# Patient Record
Sex: Female | Born: 1950 | Race: White | Hispanic: No | Marital: Married | Smoking: Former smoker
Health system: Southern US, Community
[De-identification: ages and names within clinical notes are randomized; demographics above are authoritative.]

## PROBLEM LIST (undated history)

## (undated) DIAGNOSIS — E785 Hyperlipidemia, unspecified: Secondary | ICD-10-CM

## (undated) DIAGNOSIS — F329 Major depressive disorder, single episode, unspecified: Secondary | ICD-10-CM

## (undated) DIAGNOSIS — T7840XA Allergy, unspecified, initial encounter: Secondary | ICD-10-CM

## (undated) DIAGNOSIS — F32A Depression, unspecified: Secondary | ICD-10-CM

## (undated) HISTORY — PX: AUGMENTATION MAMMAPLASTY: SUR837

## (undated) HISTORY — DX: Allergy, unspecified, initial encounter: T78.40XA

## (undated) HISTORY — DX: Depression, unspecified: F32.A

## (undated) HISTORY — PX: BREAST SURGERY: SHX581

## (undated) HISTORY — PX: TUBAL LIGATION: SHX77

## (undated) HISTORY — DX: Hyperlipidemia, unspecified: E78.5

## (undated) HISTORY — DX: Major depressive disorder, single episode, unspecified: F32.9

---
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
## 1989-01-08 HISTORY — PX: ABDOMINAL HYSTERECTOMY: SHX81

## 2007-04-18 DIAGNOSIS — H53129 Transient visual loss, unspecified eye: Secondary | ICD-10-CM

## 2007-04-21 ENCOUNTER — Ambulatory Visit: Payer: Self-pay | Admitting: Family Medicine

## 2007-04-21 DIAGNOSIS — F3340 Major depressive disorder, recurrent, in remission, unspecified: Secondary | ICD-10-CM

## 2007-04-21 LAB — CONVERTED CEMR LAB
AST: 20 units/L (ref 0–37)
Albumin: 4.4 g/dL (ref 3.5–5.2)
Alkaline Phosphatase: 60 units/L (ref 39–117)
BUN: 10 mg/dL (ref 6–23)
Bilirubin, Direct: 0.1 mg/dL (ref 0.0–0.3)
Chloride: 102 meq/L (ref 96–112)
Cholesterol: 237 mg/dL (ref 0–200)
Eosinophils Absolute: 0.1 10*3/uL (ref 0.0–0.7)
Eosinophils Relative: 1.8 % (ref 0.0–5.0)
GFR calc Af Amer: 95 mL/min
GFR calc non Af Amer: 79 mL/min
HDL: 62.4 mg/dL (ref >39.0)
Ketones, urine, test strip: NEGATIVE
MCV: 97.9 fL (ref 78.0–100.0)
Neutrophils Relative %: 63.8 % (ref 43.0–77.0)
Nitrite: NEGATIVE
Platelets: 301 10*3/uL (ref 150–400)
Potassium: 4.8 meq/L (ref 3.5–5.1)
RDW: 12.3 % (ref 11.5–14.6)
Sodium: 141 meq/L (ref 135–145)
Specific Gravity, Urine: >=1.03
Total Bilirubin: 1 mg/dL (ref 0.3–1.2)
Total CHOL/HDL Ratio: 3.8
Triglycerides: 96 mg/dL (ref 0–149)
Urobilinogen, UA: 0.2
WBC: 6.1 10*3/uL (ref 4.5–10.5)

## 2007-04-25 ENCOUNTER — Ambulatory Visit: Payer: Self-pay | Admitting: Family Medicine

## 2007-05-01 ENCOUNTER — Encounter: Admission: RE | Admit: 2007-05-01 | Discharge: 2007-05-01 | Payer: Self-pay | Admitting: Family Medicine

## 2007-05-01 ENCOUNTER — Ambulatory Visit: Payer: Self-pay

## 2007-05-06 ENCOUNTER — Ambulatory Visit: Payer: Self-pay | Admitting: Family Medicine

## 2015-08-17 ENCOUNTER — Ambulatory Visit: Payer: Self-pay | Admitting: Family Medicine

## 2015-09-16 ENCOUNTER — Ambulatory Visit: Payer: Self-pay | Admitting: Family Medicine

## 2015-10-14 ENCOUNTER — Ambulatory Visit (INDEPENDENT_AMBULATORY_CARE_PROVIDER_SITE_OTHER): Payer: Medicare Other | Admitting: Family Medicine

## 2015-10-14 ENCOUNTER — Encounter: Payer: Self-pay | Admitting: Gastroenterology

## 2015-10-14 ENCOUNTER — Other Ambulatory Visit (HOSPITAL_COMMUNITY)
Admission: RE | Admit: 2015-10-14 | Discharge: 2015-10-14 | Disposition: A | Discharge status: Home / Self Care | Payer: Medicare Other | Source: Ambulatory Visit | Attending: Family Medicine | Admitting: Family Medicine

## 2015-10-14 ENCOUNTER — Encounter: Payer: Self-pay | Admitting: Family Medicine

## 2015-10-14 VITALS — BP 110/80 | HR 65 | Temp 97.9°F | Resp 12 | Ht 66.0 in | Wt 140.5 lb

## 2015-10-14 DIAGNOSIS — F3340 Major depressive disorder, recurrent, in remission, unspecified: Secondary | ICD-10-CM

## 2015-10-14 DIAGNOSIS — Z23 Encounter for immunization: Secondary | ICD-10-CM | POA: Diagnosis not present

## 2015-10-14 DIAGNOSIS — Z780 Asymptomatic menopausal state: Secondary | ICD-10-CM

## 2015-10-14 DIAGNOSIS — E7800 Pure hypercholesterolemia, unspecified: Secondary | ICD-10-CM

## 2015-10-14 DIAGNOSIS — Z01411 Encounter for gynecological examination (general) (routine) with abnormal findings: Secondary | ICD-10-CM | POA: Insufficient documentation

## 2015-10-14 DIAGNOSIS — Z1211 Encounter for screening for malignant neoplasm of colon: Secondary | ICD-10-CM

## 2015-10-14 DIAGNOSIS — Z1231 Encounter for screening mammogram for malignant neoplasm of breast: Secondary | ICD-10-CM

## 2015-10-14 DIAGNOSIS — G4700 Insomnia, unspecified: Secondary | ICD-10-CM | POA: Diagnosis not present

## 2015-10-14 DIAGNOSIS — N852 Hypertrophy of uterus: Secondary | ICD-10-CM

## 2015-10-14 DIAGNOSIS — Z1239 Encounter for other screening for malignant neoplasm of breast: Secondary | ICD-10-CM

## 2015-10-14 DIAGNOSIS — Z1151 Encounter for screening for human papillomavirus (HPV): Secondary | ICD-10-CM | POA: Diagnosis not present

## 2015-10-14 DIAGNOSIS — Z0000 Encounter for general adult medical examination without abnormal findings: Secondary | ICD-10-CM | POA: Diagnosis not present

## 2015-10-14 DIAGNOSIS — Z124 Encounter for screening for malignant neoplasm of cervix: Secondary | ICD-10-CM

## 2015-10-14 DIAGNOSIS — Z1159 Encounter for screening for other viral diseases: Secondary | ICD-10-CM

## 2015-10-14 MED ORDER — MELATONIN ER 5 MG PO TBCR: 5.0000 mg | EXTENDED_RELEASE_TABLET | Freq: Every day | ORAL | 3 refills | Status: DC

## 2015-10-14 MED ORDER — MELATONIN ER 5 MG PO TBCR: 5.0000 mg | EXTENDED_RELEASE_TABLET | Freq: Every day | ORAL | 3 refills | Status: AC

## 2015-10-14 NOTE — Patient Instructions (Addendum)
A few things to remember from today's visit:   Welcome to Medicare preventive visit  Insomnia, unspecified type  Colon cancer screening - Plan: Ambulatory referral to Gastroenterology  Asymptomatic postmenopausal estrogen deficiency - Plan: DG Bone Density  Breast cancer screening - Plan: MM SCREENING BREAST TOMO BILATERAL  Cervical cancer screening  Bulky or enlarged uterus - Plan: US OB Transvaginal  Pure hypercholesterolemia - Plan: Lipid panel, Basic Metabolic Panel  Encounter for hepatitis C screening test for low risk patient - Plan: Hep C Antibody  A few tips:  -As we age balance is not as good as it was, so there is a higher risks for falls. Please remove small rugs and furniture that is "in your way" and could increase the risk of falls. Stretching exercises may help with fall prevention: Yoga and Tai Chi are some examples. Low impact exercise is better, so you are not very achy the next day.  -Sun screen and avoidance of direct sun light recommended. Caution with dehydration, if working outdoors be sure to drink enough fluids.  - Some medications are not safe as we age, increases the risk of side effects and can potentially interact with other medication you are also taken;  including some of over the counter medications. Be sure to let me know when you start a new medication even if it is a dietary/vitamin supplement.   -Healthy diet low in red meet/animal fat and sugar + regular physical activity is recommended.      Please be sure medication list is accurate. If a new problem present, please set up appointment sooner than planned today.

## 2015-10-14 NOTE — Progress Notes (Addendum)
HPI:   [name] is a 65 y.o. female, who is here today to establish care with me and for her routine physical. She now has Medicare and has not had her welcome to Medicare visit; she was hoping it could be done today.  Former PCP: 2009. Last preventive routine visit: 2012.   She lives with husban. Independent ADL's and IADL's. No falls in the past year and denies depression symptoms in the past few months but has Hx of depression. No Hx of hospitalizations due to psychiatric disorders and no suicidal thoughts.   She exercises regularly, goes to the gym 3 times per week. She follows a healthy diet.   She denies any Hx of STDs or abnormal Pap smear. Last Pap smear about 7-8 years ago. She is reporting hysterectomy but when asked if she had a cervix left she thinks so. M: 12 LMP 1991. G:2 FHx for gyn cancer negative. FHx for colon cancer, mother at age 2.  She has not had colonoscopy or DEXA. She is due for mammogram.   Denies abdominal pain, nausea, vomiting, changes in bowel habits, blood in stool or melena.  Hx of HLD, last FLP in 2009  Lab Results  Component Value Date   CHOL 237 (HH) 04/21/2007   HDL 62.4 04/21/2007   LDLDIRECT 137.9 04/21/2007   TRIG 96 04/21/2007   CHOLHDL 3.8 CALC 04/21/2007     Insomnia: wakes up 6-8 times at night, no problem falling as sleep. She takes OTC sleep aid but not helping much. 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    Review of Systems  Constitutional: Negative for appetite change, fatigue, fever and unexpected weight change.  HENT: Negative for dental problem, hearing loss, mouth sores, nosebleeds, trouble swallowing and voice change.   Eyes: Negative for photophobia, pain and visual disturbance.  Respiratory: Negative for cough, shortness of breath and wheezing.   Cardiovascular: Negative for chest  pain and leg swelling.  Gastrointestinal: Negative for abdominal pain, blood in stool, nausea and vomiting.       No changes in bowel habits.  Endocrine: Negative for cold intolerance, heat intolerance, polydipsia, polyphagia and polyuria.  Genitourinary: Negative for decreased urine volume, difficulty urinating, dyspareunia, dysuria, frequency, genital sores, hematuria, pelvic pain, urgency, vaginal bleeding, vaginal discharge and vaginal pain.       No breast tenderness or nipple discharge.  Musculoskeletal: Negative for arthralgias and back pain.  Skin: Negative for color change and rash.  Neurological: Negative for syncope, weakness, numbness and headaches.  Hematological: Negative for adenopathy. Bruises/bleeds easily.  Psychiatric/Behavioral: Positive for sleep disturbance. Negative for confusion and suicidal ideas. The patient is not nervous/anxious.   All other systems reviewed and are negative.     No current outpatient prescriptions on file prior to visit.   No current facility-administered medications on file prior to visit.      Past Medical History:  Diagnosis Date  . Depression   . Hyperlipidemia    Not on File    Family History  Problem Relation Age of Onset  . Heart disease Father     Social History   Social History  . Marital status: Married    Spouse name: N/A  . Number of children: N/A  . Years of education: N/A   Social History Main Topics  . Smoking status: Former Research scientist (life sciences)  . Smokeless tobacco: Never  Used  . Alcohol use No  . Drug use: No  . Sexual activity: Yes    Birth control/ protection: None     Comment: N/A   Other Topics Concern  . None   Social History Narrative  . None    Vitals:   10/14/15 0948  BP: 110/80  Pulse: 65  Resp: 12  Temp: 97.9 F (36.6 C)     Body mass index is 22.68 kg/m.    Physical Exam  Nursing note and vitals reviewed. Constitutional: She is oriented to person, place, and time. She appears  well-developed. No distress.  HENT:  Head: Atraumatic.  Right Ear: Hearing and external ear normal.  Left Ear: Hearing and external ear normal.  Mouth/Throat: Uvula is midline, oropharynx is clear and moist and mucous membranes are normal.  Eyes: Conjunctivae and EOM are normal. Pupils are equal, round, and reactive to light.  Neck: No thyroid mass and no thyromegaly present.  Cardiovascular: Normal rate and regular rhythm.   No murmur heard. Pulses:      Dorsalis pedis pulses are 2+ on the right side, and 2+ on the left side.       Posterior tibial pulses are 2+ on the right side, and 2+ on the left side.  Respiratory: Effort normal and breath sounds normal. No respiratory distress.  GI: Soft. She exhibits no mass. There is no hepatomegaly. There is no tenderness.  Genitourinary: No breast swelling, tenderness or discharge. There is no rash, tenderness or lesion on the right labia. There is no rash, tenderness or lesion on the left labia. Right adnexum displays no mass and no tenderness. Left adnexum displays no mass and no tenderness. No erythema or tenderness in the vagina. No vaginal discharge found.  Genitourinary Comments: Atrophic vaginal mucosa. She is supposed to have hysterectomy, I palpated a bulky, hard mass, no mobile.  Musculoskeletal: She exhibits no edema or tenderness.  No major deformity or sing of synovitis appreciated.  Lymphadenopathy:    She has no cervical adenopathy.       Right: No inguinal and no supraclavicular adenopathy present.       Left: No inguinal and no supraclavicular adenopathy present.  Neurological: She is alert and oriented to person, place, and time. She has normal strength. No cranial nerve deficit. Coordination and gait normal.  Reflex Scores:      Bicep reflexes are 2+ on the right side and 2+ on the left side.      Patellar reflexes are 2+ on the right side and 2+ on the left side. Get up and go test normal, < 15 seconds  Skin: Skin is warm.  No rash noted. No erythema.  Psychiatric: She has a normal mood and affect. Her speech is normal. Cognition and memory are normal.  Well groomed, good eye contact.      ASSESSMENT AND PLAN:     Stacey Perry was seen today for new patient (initial visit).  Diagnoses and all orders for this visit:  Welcome to Medicare preventive visit   We discussed the importance of regular physical activity and healthy diet for prevention of chronic illness and/or complications. Preventive guidelines reviewed. Depression screening negative. Vaccination not up to date, today Prevnar 13, will find out about insurance coverage for Zoster, and not interested in Flu vaccine. Cognitive function intact based on observation and Hx. She does not have a POA or living will, recommended, she is familiar with process because did it before with her ex-husband. Fall precautions.  Ca++ and vit D supplementation discussed and recommended. Vit E recommend discontinuing. Next Medicare preventive visit in a year.    Insomnia, unspecified type  Good sleep hygiene. She has not tried melatonin in the past, recommend melatonin ER 5 mg. She was instructed to let me know if melatonin does not help, in which case we may try Trazodone or Doxepin. Some side effects disscused. Follow-up as needed.   -     Melatonin ER 5 MG TBCR; Take 5 mg by mouth at bedtime.  Colon cancer screening -     Ambulatory referral to Gastroenterology  Asymptomatic postmenopausal estrogen deficiency -     DG Bone Density; Future  Breast cancer screening -     MM SCREENING BREAST TOMO BILATERAL; Future  Cervical cancer screening -     PAP [[facility]]  Abnormal pelvic exam  We discussed possible causes, ? Scar tissue. Transvaginal ultrasound will be arranged and further recommendations will be given accordingly.  -     US Transvaginal; Future  Pure hypercholesterolemia  Continue low fat diet and regular exercise. Further  recommendations will be given according to lab results.  -     Lipid panel; Future -     Basic Metabolic Panel; Future   Depression, major, recurrent, in remission ([place])  Currently asymptomatic. Follow-up in a year, before if needed.   Encounter for hepatitis C screening test for low risk patient -     Hep C Antibody; Future  Need for pneumococcal vaccine -     Pneumococcal conjugate vaccine 13-valent      -She is not fasting today, so she will be back next week for fasting labs.       Jonny Dearden G. Martinique, MD  [facility]. [place] office.

## 2015-10-14 NOTE — Progress Notes (Signed)
Pre visit review using our clinic review tool, if applicable. No additional management support is needed unless otherwise documented below in the visit note. 

## 2015-10-14 NOTE — Addendum Note (Signed)
Addended by: Martinique, Larrisha Babineau G on: 10/14/2015 12:23 PM   Modules accepted: Orders

## 2015-10-17 LAB — CYTOLOGY - PAP

## 2015-10-19 ENCOUNTER — Other Ambulatory Visit (INDEPENDENT_AMBULATORY_CARE_PROVIDER_SITE_OTHER): Payer: Medicare Other

## 2015-10-19 DIAGNOSIS — E7800 Pure hypercholesterolemia, unspecified: Secondary | ICD-10-CM

## 2015-10-19 DIAGNOSIS — Z1159 Encounter for screening for other viral diseases: Secondary | ICD-10-CM

## 2015-10-19 LAB — LIPID PANEL
CHOLESTEROL: 216 mg/dL — AB (ref 0–200)
HDL: 58.4 mg/dL (ref >39.00)
LDL CALC: 137 mg/dL — AB (ref 0–99)
NonHDL: 157.69
Total CHOL/HDL Ratio: 4
Triglycerides: 102 mg/dL (ref 0.0–149.0)
VLDL: 20.4 mg/dL (ref 0.0–40.0)

## 2015-10-19 LAB — BASIC METABOLIC PANEL
BUN: 10 mg/dL (ref 6–23)
CALCIUM: 9.4 mg/dL (ref 8.4–10.5)
CO2: 27 mEq/L (ref 19–32)
Chloride: 108 mEq/L (ref 96–112)
Creatinine, Ser: 0.81 mg/dL (ref 0.40–1.20)
GFR: 75.32 mL/min (ref >60.00)
GLUCOSE: 94 mg/dL (ref 70–99)
Potassium: 4.7 mEq/L (ref 3.5–5.1)
SODIUM: 142 meq/L (ref 135–145)

## 2015-10-20 LAB — HEPATITIS C ANTIBODY: HCV Ab: NEGATIVE

## 2015-10-24 ENCOUNTER — Other Ambulatory Visit: Payer: Self-pay | Admitting: *Deleted

## 2015-10-24 ENCOUNTER — Other Ambulatory Visit: Payer: Self-pay

## 2015-10-24 ENCOUNTER — Telehealth: Payer: Self-pay | Admitting: Family Medicine

## 2015-10-24 DIAGNOSIS — Z01411 Encounter for gynecological examination (general) (routine) with abnormal findings: Secondary | ICD-10-CM

## 2015-10-24 NOTE — Telephone Encounter (Signed)
Patient informed of Pap results. Patient verbalized understanding and will call back to make 6 month appt for re check.

## 2015-10-24 NOTE — Telephone Encounter (Signed)
Pt would like  results of pap. Doesn't remember getting that info.

## 2015-10-28 ENCOUNTER — Other Ambulatory Visit: Payer: Self-pay | Admitting: Family Medicine

## 2015-10-28 DIAGNOSIS — Z1231 Encounter for screening mammogram for malignant neoplasm of breast: Secondary | ICD-10-CM

## 2015-12-05 ENCOUNTER — Ambulatory Visit (INDEPENDENT_AMBULATORY_CARE_PROVIDER_SITE_OTHER)
Admission: RE | Admit: 2015-12-05 | Discharge: 2015-12-05 | Disposition: A | Discharge status: Home / Self Care | Payer: Medicare Other | Source: Ambulatory Visit | Attending: Family Medicine | Admitting: Family Medicine

## 2015-12-05 ENCOUNTER — Ambulatory Visit
Admission: RE | Admit: 2015-12-05 | Discharge: 2015-12-05 | Disposition: A | Discharge status: Home / Self Care | Payer: Medicare Other | Source: Ambulatory Visit | Attending: Family Medicine | Admitting: Family Medicine

## 2015-12-05 ENCOUNTER — Ambulatory Visit (AMBULATORY_SURGERY_CENTER): Payer: Self-pay | Admitting: *Deleted

## 2015-12-05 VITALS — Ht 67.0 in | Wt 143.0 lb

## 2015-12-05 DIAGNOSIS — Z1231 Encounter for screening mammogram for malignant neoplasm of breast: Secondary | ICD-10-CM

## 2015-12-05 DIAGNOSIS — Z01411 Encounter for gynecological examination (general) (routine) with abnormal findings: Secondary | ICD-10-CM

## 2015-12-05 DIAGNOSIS — Z780 Asymptomatic menopausal state: Secondary | ICD-10-CM

## 2015-12-05 DIAGNOSIS — Z800 Family history of malignant neoplasm of digestive organs: Secondary | ICD-10-CM

## 2015-12-05 DIAGNOSIS — R1909 Other intra-abdominal and pelvic swelling, mass and lump: Secondary | ICD-10-CM | POA: Diagnosis not present

## 2015-12-05 MED ORDER — NA SULFATE-K SULFATE-MG SULF 17.5-3.13-1.6 GM/177ML PO SOLN: 1.0000 | Freq: Once | ORAL | 0 refills | Status: AC

## 2015-12-05 NOTE — Progress Notes (Signed)
No egg or soy allergy known to patient  No issues with past sedation with any surgeries  or procedures, no intubation problems  No diet pills per patient No home 02 use per patient  No blood thinners per patient  Pt denies issues with constipation  No A fib or A flutter  emmi video to e mail    

## 2015-12-07 ENCOUNTER — Other Ambulatory Visit: Payer: Self-pay | Admitting: Family Medicine

## 2015-12-07 DIAGNOSIS — R1900 Intra-abdominal and pelvic swelling, mass and lump, unspecified site: Secondary | ICD-10-CM

## 2015-12-08 ENCOUNTER — Other Ambulatory Visit: Payer: Self-pay

## 2015-12-08 DIAGNOSIS — R1900 Intra-abdominal and pelvic swelling, mass and lump, unspecified site: Secondary | ICD-10-CM

## 2015-12-09 ENCOUNTER — Other Ambulatory Visit: Payer: Self-pay

## 2015-12-09 DIAGNOSIS — R1900 Intra-abdominal and pelvic swelling, mass and lump, unspecified site: Secondary | ICD-10-CM

## 2015-12-11 ENCOUNTER — Encounter: Payer: Self-pay | Admitting: Family Medicine

## 2015-12-12 ENCOUNTER — Other Ambulatory Visit: Payer: Self-pay | Admitting: Family Medicine

## 2015-12-12 ENCOUNTER — Other Ambulatory Visit (INDEPENDENT_AMBULATORY_CARE_PROVIDER_SITE_OTHER): Payer: Medicare Other

## 2015-12-12 ENCOUNTER — Inpatient Hospital Stay: Admission: RE | Admit: 2015-12-12 | Payer: Medicare Other | Source: Ambulatory Visit

## 2015-12-12 DIAGNOSIS — R928 Other abnormal and inconclusive findings on diagnostic imaging of breast: Secondary | ICD-10-CM

## 2015-12-12 DIAGNOSIS — R1900 Intra-abdominal and pelvic swelling, mass and lump, unspecified site: Secondary | ICD-10-CM | POA: Diagnosis not present

## 2015-12-12 LAB — CREATININE, SERUM: Creatinine, Ser: 0.85 mg/dL (ref 0.40–1.20)

## 2015-12-12 LAB — BUN: BUN: 15 mg/dL (ref 6–23)

## 2015-12-13 ENCOUNTER — Telehealth: Payer: Self-pay | Admitting: Gastroenterology

## 2015-12-13 NOTE — Telephone Encounter (Signed)
Pt returned call. She states her insurance is charging  $150.00 for the [place] and she can not afford it! Informed pt we would try to get her a sample or we could change the prep to the Miralax. Pt states she would like to get a sample of the Suprep , if possible. Will speak to [facility] CMA regarding getting a sample, and call her back. Pt understood.

## 2015-12-13 NOTE — Telephone Encounter (Signed)
Called pt and left message to call our office back regarding her prep.

## 2015-12-14 ENCOUNTER — Telehealth: Payer: Self-pay

## 2015-12-14 NOTE — Telephone Encounter (Signed)
Left message for patient to come pick up sample

## 2015-12-14 NOTE — Telephone Encounter (Signed)
Left message on voicemail that I would leave a suprep sample up front to be picked up.

## 2015-12-16 ENCOUNTER — Encounter: Payer: Medicare Other | Admitting: Gastroenterology

## 2015-12-16 ENCOUNTER — Ambulatory Visit (INDEPENDENT_AMBULATORY_CARE_PROVIDER_SITE_OTHER)
Admission: RE | Admit: 2015-12-16 | Discharge: 2015-12-16 | Disposition: A | Discharge status: Home / Self Care | Payer: Medicare Other | Source: Ambulatory Visit | Attending: Family Medicine | Admitting: Family Medicine

## 2015-12-16 DIAGNOSIS — R1909 Other intra-abdominal and pelvic swelling, mass and lump: Secondary | ICD-10-CM | POA: Diagnosis not present

## 2015-12-16 DIAGNOSIS — R1900 Intra-abdominal and pelvic swelling, mass and lump, unspecified site: Secondary | ICD-10-CM

## 2015-12-16 MED ORDER — IOPAMIDOL (ISOVUE-300) INJECTION 61%
100.0000 mL | Freq: Once | INTRAVENOUS | Status: AC | PRN
  Administered 2015-12-16: 100 mL via INTRAVENOUS

## 2015-12-18 ENCOUNTER — Encounter: Payer: Self-pay | Admitting: Family Medicine

## 2015-12-18 ENCOUNTER — Other Ambulatory Visit: Payer: Self-pay | Admitting: Family Medicine

## 2015-12-18 DIAGNOSIS — R1900 Intra-abdominal and pelvic swelling, mass and lump, unspecified site: Secondary | ICD-10-CM

## 2015-12-22 ENCOUNTER — Encounter: Payer: Self-pay | Admitting: Obstetrics and Gynecology

## 2015-12-22 ENCOUNTER — Ambulatory Visit (INDEPENDENT_AMBULATORY_CARE_PROVIDER_SITE_OTHER): Payer: Medicare Other | Admitting: Obstetrics and Gynecology

## 2015-12-22 ENCOUNTER — Encounter: Payer: Self-pay | Admitting: Gynecologic Oncology

## 2015-12-22 ENCOUNTER — Telehealth: Payer: Self-pay | Admitting: Gynecologic Oncology

## 2015-12-22 VITALS — BP 120/80 | HR 78 | Resp 16 | Ht 64.75 in | Wt 140.6 lb

## 2015-12-22 DIAGNOSIS — R1900 Intra-abdominal and pelvic swelling, mass and lump, unspecified site: Secondary | ICD-10-CM

## 2015-12-22 NOTE — Telephone Encounter (Signed)
Appt scheduled w/Dr. Alycia Rossetti on 01/11/16 at 830am. Dr. Gentry Fitz office will notify the pt. Letter mailed.

## 2015-12-22 NOTE — Progress Notes (Signed)
Scheduled patient while in office to see Dr.Gehrig at [facility] on 01/11/2016 at 8:30 am. Patient is agreeable to date and time.

## 2015-12-22 NOTE — Progress Notes (Signed)
65 y.o. [id number] MarriedCaucasianF here for referral from Dr. Martinique.   Patient had CT scan performed and was told had a possible mass on R ovary. This mass was noted on exam from her primary. She has no bowel or bladder c/o. Patient denies any pain. She reports a history of a total hysterectomy over 20 years ago, she still has her ovaries.      No LMP recorded. Patient is not currently having periods (Reason: Perimenopausal).          Sexually active: Yes.    The current method of family planning is Perimenopause.    Exercising: Yes.    Walking, Treadmill Smoker:  Former  Health Maintenance: Pap: 10/14/15 ASCUS Neg HR HPV History of abnormal Pap:  no MMG: 12/09/15 BIRADS0; Dx Mammogram scheduled for 12/26/15 Colonoscopy: Scheduled for 12/24/15.  BMD:  12/11/15 Osteoporosis TDaP: 2009    reports that she has quit smoking. She has never used smokeless tobacco. She reports that she does not drink alcohol or use drugs.  Past Medical History:  Diagnosis Date  . Allergy   . Depression   . Hyperlipidemia     Past Surgical History:  Procedure Laterality Date  . ABDOMINAL HYSTERECTOMY     1991  . BREAST SURGERY     Augmentation (implants)  . TONSILLECTOMY  1957    Current Outpatient Prescriptions  Medication Sig Dispense Refill  . Biotin 5000 MCG TABS Take 1 tablet by mouth daily.    . Calcium Carb-Cholecalciferol (CALCIUM 1000 + D) 1000-800 MG-UNIT TABS Take 1 capsule by mouth daily.    . Cholecalciferol 4000 units CAPS Take 1 capsule by mouth daily.    . Cinnamon 500 MG TABS Take 1 tablet by mouth daily.    . Melatonin ER 5 MG TBCR Take 5 mg by mouth at bedtime. 90 tablet 3  . thiamine (VITAMIN B-1) 100 MG tablet Take 200 mg by mouth daily.    . vitamin C (ASCORBIC ACID) 500 MG tablet Take 500 mg by mouth daily.     No current facility-administered medications for this visit.     Family History  Problem Relation Age of Onset  . Colon cancer Mother 92  . Heart disease Father    . Colon polyps Neg Hx   . Rectal cancer Neg Hx   . Stomach cancer Neg Hx     Review of Systems  Constitutional: Negative.   HENT: Negative.   Eyes: Negative.   Respiratory: Negative.   Cardiovascular: Negative.   Gastrointestinal: Negative.   Genitourinary:       CT scan showed possible mass on R ovary.   Musculoskeletal: Negative.   Skin: Negative.   Allergic/Immunologic: Negative.   Neurological: Negative.   Hematological: Negative.   Psychiatric/Behavioral: Negative.     Exam:   BP 120/80 (BP Location: Right Arm, Patient Position: Sitting, Cuff Size: Normal)   Pulse 78   Resp 16   Ht 5' 4.75" (1.645 m)   Wt 140 lb 9.6 oz (63.8 kg)   BMI 23.58 kg/m   Weight change: @WEIGHTCHANGE @ Height:   Height: 5' 4.75" (164.5 cm)  Ht Readings from Last 3 Encounters:  12/22/15 5' 4.75" (1.645 m)  12/05/15 5\' 7"  (1.702 m)  10/14/15 5\' 6"  (1.676 m)    General appearance: alert, cooperative and appears stated age  A:  Postmenopausal female with a 10.7 cm solid pelvic mass, suspected ovarian mass  P:   Referral to GYN oncology  She has a  colonoscopy set up next week

## 2015-12-23 ENCOUNTER — Encounter: Payer: Self-pay | Admitting: Gastroenterology

## 2015-12-23 ENCOUNTER — Ambulatory Visit (AMBULATORY_SURGERY_CENTER): Payer: Medicare Other | Admitting: Gastroenterology

## 2015-12-23 ENCOUNTER — Encounter: Payer: Medicare Other | Admitting: Gastroenterology

## 2015-12-23 VITALS — BP 106/48 | HR 67 | Temp 98.9°F | Resp 18 | Ht 67.0 in | Wt 143.0 lb

## 2015-12-23 DIAGNOSIS — Z1211 Encounter for screening for malignant neoplasm of colon: Secondary | ICD-10-CM

## 2015-12-23 DIAGNOSIS — K635 Polyp of colon: Secondary | ICD-10-CM

## 2015-12-23 DIAGNOSIS — D126 Benign neoplasm of colon, unspecified: Secondary | ICD-10-CM

## 2015-12-23 DIAGNOSIS — D123 Benign neoplasm of transverse colon: Secondary | ICD-10-CM | POA: Diagnosis not present

## 2015-12-23 DIAGNOSIS — Z1212 Encounter for screening for malignant neoplasm of rectum: Secondary | ICD-10-CM

## 2015-12-23 DIAGNOSIS — Z800 Family history of malignant neoplasm of digestive organs: Secondary | ICD-10-CM

## 2015-12-23 DIAGNOSIS — D124 Benign neoplasm of descending colon: Secondary | ICD-10-CM

## 2015-12-23 HISTORY — PX: COLONOSCOPY: SHX174

## 2015-12-23 MED ORDER — SODIUM CHLORIDE 0.9 % IV SOLN: 500.0000 mL | INTRAVENOUS | Status: DC

## 2015-12-23 NOTE — Patient Instructions (Signed)
YOU HAD AN ENDOSCOPIC PROCEDURE TODAY AT THE [facility]:   Refer to the procedure report that was given to you for any specific questions about what was found during the examination.  If the procedure report does not answer your questions, please call your gastroenterologist to clarify.  If you requested that your care partner not be given the details of your procedure findings, then the procedure report has been included in a sealed envelope for you to review at your convenience later.  YOU SHOULD EXPECT: Some feelings of bloating in the abdomen. Passage of more gas than usual.  Walking can help get rid of the air that was put into your GI tract during the procedure and reduce the bloating. If you had a lower endoscopy (such as a colonoscopy or flexible sigmoidoscopy) you may notice spotting of blood in your stool or on the toilet paper. If you underwent a bowel prep for your procedure, you may not have a normal bowel movement for a few days.  Please Note:  You might notice some irritation and congestion in your nose or some drainage.  This is from the oxygen used during your procedure.  There is no need for concern and it should clear up in a day or so.  SYMPTOMS TO REPORT IMMEDIATELY:   Following lower endoscopy (colonoscopy or flexible sigmoidoscopy):  Excessive amounts of blood in the stool  Significant tenderness or worsening of abdominal pains  Swelling of the abdomen that is new, acute  Fever of 100F or higher   Following upper endoscopy (EGD)  Vomiting of blood or coffee ground material  New chest pain or pain under the shoulder blades  Painful or persistently difficult swallowing  New shortness of breath  Fever of 100F or higher  Black, tarry-looking stools  For urgent or emergent issues, a gastroenterologist can be reached at any hour by calling [phone].   DIET:  We do recommend a small meal at first, but then you may proceed to your regular diet.  Drink  plenty of fluids but you should avoid alcoholic beverages for 24 hours.  ACTIVITY:  You should plan to take it easy for the rest of today and you should NOT DRIVE or use heavy machinery until tomorrow (because of the sedation medicines used during the test).    FOLLOW UP: Our staff will call the number listed on your records the next business day following your procedure to check on you and address any questions or concerns that you may have regarding the information given to you following your procedure. If we do not reach you, we will leave a message.  However, if you are feeling well and you are not experiencing any problems, there is no need to return our call.  We will assume that you have returned to your regular daily activities without incident.  If any biopsies were taken you will be contacted by phone or by letter within the next 1-3 weeks.  Please call us at [phone] if you have not heard about the biopsies in 3 weeks.    SIGNATURES/CONFIDENTIALITY: You and/or your care partner have signed paperwork which will be entered into your electronic medical record.  These signatures attest to the fact that that the information above on your After Visit Summary has been reviewed and is understood.  Full responsibility of the confidentiality of this discharge information lies with you and/or your care-partner.  Polyp and hemorrhoid information given. 

## 2015-12-23 NOTE — Progress Notes (Signed)
Patient awakening,vss,report to rn 

## 2015-12-23 NOTE — Op Note (Signed)
[facility] Patient Name: Stacey Perry Procedure Date: 12/23/2015 3:36 PM MRN: [id number] Endoscopist: Remo Lipps P. Armbruster MD, MD Age: 65 Referring MD:  Date of Birth: 1950/09/15 Gender: Female Account #: [id number] Procedure:                Colonoscopy Indications:              Screening patient at increased risk: mother with                            colon cancer reported around age 67, first time                            colonoscopy Medicines:                Monitored Anesthesia Care Procedure:                Pre-Anesthesia Assessment:                           - Prior to the procedure, a History and Physical                            was performed, and patient medications and                            allergies were reviewed. The patient's tolerance of                            previous anesthesia was also reviewed. The risks                            and benefits of the procedure and the sedation                            options and risks were discussed with the patient.                            All questions were answered, and informed consent                            was obtained. Prior Anticoagulants: The patient has                            taken no previous anticoagulant or antiplatelet                            agents. ASA Grade Assessment: II - A patient with                            mild systemic disease. After reviewing the risks                            and benefits, the patient was deemed in  satisfactory condition to undergo the procedure.                           After obtaining informed consent, the colonoscope                            was passed under direct vision. Throughout the                            procedure, the patient's blood pressure, pulse, and                            oxygen saturations were monitored continuously. The                            Model PCF-H190L [id number]) scope was  introduced                            through the anus and advanced to the the cecum,                            identified by appendiceal orifice and ileocecal                            valve. The colonoscopy was performed without                            difficulty. The patient tolerated the procedure                            well. The quality of the bowel preparation was                            good. The ileocecal valve, appendiceal orifice, and                            rectum were photographed. Scope In: 3:44:18 PM Scope Out: 4:08:11 PM Scope Withdrawal Time: 0 hours 17 minutes 50 seconds  Total Procedure Duration: 0 hours 23 minutes 53 seconds  Findings:                 The perianal and digital rectal examinations were                            normal.                           A 8 mm polyp was found in the hepatic flexure. The                            polyp was sessile. The polyp was removed with a                            cold snare. Resection and retrieval were complete.  Three sessile polyps were found in the transverse                            colon. The polyps were 4 to 6 mm in size. These                            polyps were removed with a cold snare. Resection                            and retrieval were complete.                           Two sessile polyps were found in the splenic                            flexure. The polyps were 5 to 8 mm in size. These                            polyps were removed with a cold snare. Resection                            and retrieval were complete.                           A 5 mm polyp was found in the descending colon. The                            polyp was sessile. The polyp was removed with a                            cold snare. Resection and retrieval were complete.                           Internal hemorrhoids were found during                            retroflexion. The  hemorrhoids were moderate.                           The exam was otherwise without abnormality. Complications:            No immediate complications. Estimated blood loss:                            Minimal. Estimated Blood Loss:     Estimated blood loss was minimal. Impression:               - One 8 mm polyp at the hepatic flexure, removed                            with a cold snare. Resected and retrieved.                           - Three 4 to  6 mm polyps in the transverse colon,                            removed with a cold snare. Resected and retrieved.                           - Two 5 to 8 mm polyps at the splenic flexure,                            removed with a cold snare. Resected and retrieved.                           - One 5 mm polyp in the descending colon, removed                            with a cold snare. Resected and retrieved.                           - Internal hemorrhoids.                           - The examination was otherwise normal. Recommendation:           - Patient has a contact number available for                            emergencies. The signs and symptoms of potential                            delayed complications were discussed with the                            patient. Return to normal activities tomorrow.                            Written discharge instructions were provided to the                            patient.                           - Resume previous diet.                           - Continue present medications.                           - No ibuprofen, naproxen, or other non-steroidal                            anti-inflammatory drugs for 2 weeks after polyp                            removal.                           -  Await pathology results.                           - Repeat colonoscopy is recommended for                            surveillance. The colonoscopy date will be                            determined after  pathology results from today's                            exam become available for review. Remo Lipps P. Armbruster MD, MD 12/23/2015 4:13:42 PM This report has been signed electronically.

## 2015-12-23 NOTE — Progress Notes (Signed)
Called to room to assist during endoscopic procedure.  Patient ID and intended procedure confirmed with present staff. Received instructions for my participation in the procedure from the performing physician.  

## 2015-12-26 ENCOUNTER — Ambulatory Visit
Admission: RE | Admit: 2015-12-26 | Discharge: 2015-12-26 | Disposition: A | Discharge status: Home / Self Care | Payer: Medicare Other | Source: Ambulatory Visit | Attending: Family Medicine | Admitting: Family Medicine

## 2015-12-26 ENCOUNTER — Other Ambulatory Visit: Payer: Self-pay | Admitting: Family Medicine

## 2015-12-26 ENCOUNTER — Telehealth: Payer: Self-pay

## 2015-12-26 DIAGNOSIS — R928 Other abnormal and inconclusive findings on diagnostic imaging of breast: Secondary | ICD-10-CM

## 2015-12-26 DIAGNOSIS — N6320 Unspecified lump in the left breast, unspecified quadrant: Secondary | ICD-10-CM | POA: Diagnosis not present

## 2015-12-26 NOTE — Telephone Encounter (Signed)
  Follow up Call-  Call back number 12/23/2015  Post procedure Call Back phone  # [phone]  Permission to leave phone message Yes  Some recent data might be hidden     Patient questions:  Do you have a fever, pain , or abdominal swelling? No. Pain Score  0 *  Have you tolerated food without any problems? Yes.    Have you been able to return to your normal activities? Yes.    Do you have any questions about your discharge instructions: Diet   No. Medications  No. Follow up visit  No.  Do you have questions or concerns about your Care? No.  Actions: * If pain score is 4 or above: No action needed, pain <4.

## 2015-12-29 ENCOUNTER — Encounter: Payer: Medicare Other | Admitting: Gastroenterology

## 2016-01-03 ENCOUNTER — Encounter: Payer: Self-pay | Admitting: Gastroenterology

## 2016-01-05 ENCOUNTER — Other Ambulatory Visit: Payer: Self-pay

## 2016-01-05 DIAGNOSIS — R928 Other abnormal and inconclusive findings on diagnostic imaging of breast: Secondary | ICD-10-CM

## 2016-01-11 ENCOUNTER — Encounter: Payer: Self-pay | Admitting: Gynecologic Oncology

## 2016-01-11 ENCOUNTER — Other Ambulatory Visit: Payer: Self-pay | Admitting: Nurse Practitioner

## 2016-01-11 ENCOUNTER — Ambulatory Visit: Payer: Medicare Other | Attending: Gynecologic Oncology | Admitting: Gynecologic Oncology

## 2016-01-11 ENCOUNTER — Other Ambulatory Visit: Payer: Medicare Other

## 2016-01-11 VITALS — HR 75 | Resp 18 | Ht 67.0 in | Wt 146.0 lb

## 2016-01-11 DIAGNOSIS — R1909 Other intra-abdominal and pelvic swelling, mass and lump: Secondary | ICD-10-CM | POA: Diagnosis not present

## 2016-01-11 DIAGNOSIS — Z9889 Other specified postprocedural states: Secondary | ICD-10-CM | POA: Diagnosis not present

## 2016-01-11 DIAGNOSIS — N838 Other noninflammatory disorders of ovary, fallopian tube and broad ligament: Secondary | ICD-10-CM

## 2016-01-11 DIAGNOSIS — F329 Major depressive disorder, single episode, unspecified: Secondary | ICD-10-CM | POA: Insufficient documentation

## 2016-01-11 DIAGNOSIS — R1900 Intra-abdominal and pelvic swelling, mass and lump, unspecified site: Secondary | ICD-10-CM | POA: Insufficient documentation

## 2016-01-11 DIAGNOSIS — E785 Hyperlipidemia, unspecified: Secondary | ICD-10-CM | POA: Diagnosis not present

## 2016-01-11 DIAGNOSIS — Z79899 Other long term (current) drug therapy: Secondary | ICD-10-CM | POA: Diagnosis not present

## 2016-01-11 DIAGNOSIS — Z800 Family history of malignant neoplasm of digestive organs: Secondary | ICD-10-CM | POA: Diagnosis not present

## 2016-01-11 DIAGNOSIS — Z87891 Personal history of nicotine dependence: Secondary | ICD-10-CM | POA: Diagnosis not present

## 2016-01-11 DIAGNOSIS — Z8371 Family history of colonic polyps: Secondary | ICD-10-CM | POA: Insufficient documentation

## 2016-01-11 NOTE — Addendum Note (Signed)
Addended by: Joylene John D on: 01/11/2016 01:01 PM   Modules accepted: Orders

## 2016-01-11 NOTE — Patient Instructions (Signed)
Bilateral Salpingo-Oophorectomy Bilateral salpingo-oophorectomy is the surgical removal of both fallopian tubes and both ovaries. The ovaries are Wismer organs that produce eggs in women. The fallopian tubes transport the egg from the ovary to the womb (uterus). Usually, when this surgery is done, the uterus was previously removed. A bilateral salpingo-oophorectomy may be done to treat cancer or to reduce the risk of cancer in women who are at high risk. Removing both fallopian tubes and both ovaries will make you unable to become pregnant (sterile). It will also put you into menopause so that you will no longer have menstrual periods and may have menopausal symptoms such as hot flashes, night sweats, and mood changes. It will not affect your sex drive. LET [facility] CARE PROVIDER KNOW ABOUT:  Any allergies you have.  All medicines you are taking, including vitamins, herbs, eye drops, creams, and over-the-counter medicines.  Previous problems you or members of your family have had with the use of anesthetics.  Any blood disorders you have.  Previous surgeries you have had.  Medical conditions you have. RISKS AND COMPLICATIONS Generally, this is a safe procedure. However, as with any procedure, complications can occur. Possible complications include:  Injury to surrounding organs.  Bleeding.  Infection.  Blood clots in the legs or lungs.  Problems related to anesthesia. BEFORE THE PROCEDURE  Ask your health care provider about changing or stopping your regular medicines. You may need to stop taking certain medicines, such as aspirin or blood thinners, at least 1 week before the surgery.  Do not eat or drink anything for at least 8 hours before the surgery.  If you smoke, do not smoke for at least 2 weeks before the surgery.  Make plans to have someone drive you home after the procedure or after your hospital stay. Also arrange for someone to help you with activities during  recovery. PROCEDURE   You will be given medicine to help you relax before the procedure (sedative). You will then be given medicine to make you sleep through the procedure (general anesthetic). These medicines will be given through an IV access tube that is put into one of your veins.  Once you are asleep, your lower abdomen will be shaved and cleaned. A thin, flexible tube (catheter) will be placed in your bladder.  The surgeon may use a laparoscopic, robotic, or open technique for this surgery:  In the laparoscopic technique, the surgery is done through two Regnier cuts (incisions) in the abdomen. A thin, lighted tube with a tiny camera on the end (laparoscope) is inserted into one of the incisions. The tools needed for the procedure are put through the other incision.  A robotic technique may be chosen to perform complex surgery in a Yaw space. In the robotic technique, Ficek incisions will be made. A camera and surgical instruments are passed through the incisions. Surgical instruments will be controlled with the help of a robotic arm.  In the open technique, the surgery is done through one large incision in the abdomen.  Using any of these techniques, the surgeon removes the fallopian tubes and ovaries. The blood vessels will be clamped and tied.  The surgeon then uses staples or stitches to close the incision or incisions. AFTER THE PROCEDURE  You will be taken to a recovery area where you will be monitored for 1 to 3 hours. Your blood pressure, pulse, and temperature will be checked often. You will remain in the recovery area until you are stable and waking  up.  If the laparoscopic technique was used, you may be allowed to go home after several hours. You may have some shoulder pain after the laparoscopic procedure. This is normal and usually goes away in a day or two.  If the open technique was used, you will be admitted to the hospital for a couple of days.  You will be given pain  medicine as needed.  The IV access tube and catheter will be removed before you are discharged. This information is not intended to replace advice given to you by your health care provider. Make sure you discuss any questions you have with your health care provider. Document Released: 12/25/2004 Document Revised: 12/30/2012 Document Reviewed: 06/18/2012 Elsevier Interactive Patient Education  2017 [employer] Bilateral Salpingo-Oophorectomy, Care After Refer to this sheet in the next few weeks. These instructions provide you with information on caring for yourself after your procedure. Your health care provider may also give you more specific instructions. Your treatment has been planned according to current medical practices, but problems sometimes occur. Call your health care provider if you have any problems or questions after your procedure. WHAT TO EXPECT AFTER THE PROCEDURE After your procedure, it is typical to have the following:   Abdominal pain that can be controlled with medicine.  Vaginal spotting.  Constipation.  Menopausal symptoms such as hot flashes, vaginal dryness, and mood swings. HOME CARE INSTRUCTIONS   Get plenty of rest and sleep.  Only take over-the-counter or prescription medicines as directed by your health care provider. Do not take aspirin. It can cause bleeding.  Keep incision areas clean and dry. Remove or change bandages (dressings) only as directed by your health care provider.  Take showers instead of baths for a few weeks as directed by your health care provider.  Limit exercise and activities as directed by your health care provider. Do not lift anything heavier than 5 pounds (2.3 kg) until your health care provider approves.  Do not drive until your health care provider approves.  Follow your health care provider's advice regarding diet. You may be able to resume your usual diet right away.  Drink enough fluids to keep your urine clear or pale  yellow.  Do not douche, use tampons, or have sexual intercourse for 6 weeks after the procedure.  Do not drink alcohol until your health care provider says it is okay.  Take your temperature twice a day and write it down.  If you become constipated, you may:  Ask your health care provider about taking a mild laxative.  Add more fruit and bran to your diet.  Drink more fluids.  Follow up with your health care provider as directed. SEEK MEDICAL CARE IF:   You have swelling, redness, or increasing pain in the incision area.  You see pus coming from the incision area.  You notice a bad smell coming from the wound or dressing.  You have pain, redness, or swelling where the IV access tube was placed.  Your incision is breaking open (the edges are not staying together).  You feel dizzy or feel like fainting.  You develop pain or bleeding when you urinate.  You develop diarrhea.  You develop nausea and vomiting.  You develop abnormal vaginal discharge.  You develop a rash.  You have pain that is not controlled with medicine. SEEK IMMEDIATE MEDICAL CARE IF:   You develop a fever.  You develop abdominal pain.  You have chest pain.  You develop shortness of breath.  You pass out.  You develop pain, swelling, or redness in your leg.  You develop heavy vaginal bleeding with or without blood clots. This information is not intended to replace advice given to you by your health care provider. Make sure you discuss any questions you have with your health care provider. Document Released: 12/25/2004 Document Revised: 08/27/2012 Document Reviewed: 06/18/2012 Elsevier Interactive Patient Education  2017 [employer].

## 2016-01-11 NOTE — Progress Notes (Signed)
Consult Note: Gyn-Onc  Stacey Perry 66 y.o. female  CC:  Chief Complaint  Patient presents with  . pelvic mass    HPI: Patient is seen today in consultation at the request of Dr. Talbert Nan.  Patient is a 66 year old gravida 2 para 2 who went in to see her primary physician for an annual examination and a mass was appreciated. She had an ultrasound performed 12/05/2015. It revealed a large solid-appearing mass with posterior acoustic shadowing which measured 9.1 x 8.5 x 6.2 cm. It was thought to arise potentially from the right ovary but was difficult to confirm. The left ovary was normal measuring 2.2 x 1.6 x 1.9 cm. This was followed by CT scan of the abdomen and pelvis performed on 12/16/2015. The appendix was not well-visualized. The patient is status post hysterectomy. There's a large mildly complex mass lesion identified in the mid abdomen which measured 10.7 x 2.4 cm. It demonstrated some peripheral calcifications as well as some adjacent hypodense areas which may represent necrosis or possible adjacent cyst. There was no ascites. There is no mention of any omental caking. There were no enlarged abdominal or pelvic lymph nodes. No tumor markers have been drawn. The patient did have a colonoscopy December 15. It revealed tubular adenomas and hyperplastic colonic polyp. No high-grade dysplasia or malignancy identified. They recommended repeat colonoscopy in 3 years. She has a half brother who also had colon cancer in his 41s.  She states that even in retrospect she has not had any symptoms. She is sexually active and denies any dyspareunia. She denies any change in her bowel or bladder habits. She denies any nausea vomiting or other symptomatology.  She is up-to-date on her mammograms. She had one in December with a 6 month recall secondary to some calcifications. She is status post vaginal hysterectomy in 1991 secondary to heavy bleeding. She did take hormone replacement therapy for about a  year and also took birth control pills for a few years.  Review of Systems: Constitutional: Denies fever. Skin: No rash Cardiovascular: No chest pain, shortness of breath, or edema  Pulmonary: No cough  Gastro Intestinal: No nausea, vomiting, constipation, or diarrhea reported.  Genitourinary: No frequency Musculoskeletal: No joint swelling or pain.   Current Meds:  Outpatient Encounter Prescriptions as of 01/11/2016  Medication Sig  . Calcium Carb-Cholecalciferol (CALCIUM 1000 + D) 1000-800 MG-UNIT TABS Take 1 capsule by mouth daily.  . Cholecalciferol 4000 units CAPS Take 1 capsule by mouth daily.  . Cinnamon 500 MG TABS Take 1 tablet by mouth daily.  . Melatonin ER 5 MG TBCR Take 5 mg by mouth at bedtime.  . thiamine (VITAMIN B-1) 100 MG tablet Take 200 mg by mouth daily.  . vitamin C (ASCORBIC ACID) 500 MG tablet Take 500 mg by mouth daily.  . [DISCONTINUED] Biotin 5000 MCG TABS Take 1 tablet by mouth daily.   Facility-Administered Encounter Medications as of 01/11/2016  Medication  . 0.9 %  sodium chloride infusion    Allergy: No Known Allergies  Social Hx:   Social History   Social History  . Marital status: Married    Spouse name: N/A  . Number of children: N/A  . Years of education: N/A   Occupational History  . Not on file.   Social History Main Topics  . Smoking status: Former Research scientist (life sciences)  . Smokeless tobacco: Never Used  . Alcohol use Not on file  . Drug use: Unknown  . Sexual activity: Yes  Birth control/ protection: None     Comment: N/A   Other Topics Concern  . Not on file   Social History Narrative  . No narrative on file    Past Surgical Hx:  Past Surgical History:  Procedure Laterality Date  . ABDOMINAL HYSTERECTOMY     1991  . AUGMENTATION MAMMAPLASTY    . BREAST SURGERY     Augmentation (implants)  . TONSILLECTOMY  1957  . TUBAL LIGATION      Past Medical Hx:  Past Medical History:  Diagnosis Date  . Allergy   . Depression   .  Hyperlipidemia     Oncology Hx:   No history exists.    Family Hx:  Family History  Problem Relation Age of Onset  . Colon cancer Mother 26  . Heart disease Father   . Colon polyps Neg Hx   . Rectal cancer Neg Hx   . Stomach cancer Neg Hx     Vitals:  Pulse 75, resp. rate 18, height 5\' 7"  (1.702 m), weight 146 lb (66.2 kg), SpO2 98 %.  Physical Exam:  Well-nourished well-developed female in no acute distress.  Neck: Supple, no lymphadenopathy no thyromegaly.  Lungs: Clear to auscultation bilaterally.  Cardiac: Regular rate and rhythm.  Abdomen: Soft, nontender, nondistended. There are no palpable masses or hepatosplenomegaly. There is no fluid wave.  Groins: No lymphadenopathy.  Externally: No edema.  Pelvic: External genitalia within normal limits. Bimanual examination reveals a firm hard mass 4 fingerbreadths above the introitus. It is mobile. The mass is firm but there is no nodularity on rectovaginal examination.  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 She is completely asymptomatic. Secondary to what feels to be mostly a firm solid mass I do not believe that she would be a candidate for minimally invasive surgery. I would recommend that she undergo expiratory laparotomy bilateral salpingo-oophorectomy. I do think that the surgery could be done via low-transverse incision. She's tentatively scheduled for 02/02/2016 with Dr. Denman George. We do not have tumor markers. We'll obtain a CA-125 today.   Risks of surgery including but not limited to thromboembolic disease, bleeding, infection, injury to his running organs were reviewed with the patient and her husband. They understand that the masses be sent for frozen section and further surgery including staging will be based on that.  I did reassure them that I think that this is most likely a benign mass and that  the surgical date as provided is adequate.  Their questions were elicited in answer to their satisfaction.  We appreciate the opportunity to partner in the care of this very pleasant patient.  Stacey Landrigan A., MD 01/11/2016, 8:45 AM

## 2016-01-11 NOTE — Progress Notes (Signed)
Scheduling pre op-- please place surgical orders in epic  Thanks Melissa

## 2016-01-12 ENCOUNTER — Encounter: Payer: Self-pay | Admitting: Family Medicine

## 2016-01-12 LAB — CA 125: CANCER ANTIGEN (CA) 125: 13 U/mL (ref 0.0–38.1)

## 2016-01-27 NOTE — Patient Instructions (Addendum)
Stacey Perry  01/27/2016   Your procedure is scheduled [id number] 02/02/2016   Report to [facility] Main  Entrance take [place]  elevators to 3rd floor to  [place] at  [place]  AM.  Call this number if you have problems the morning of surgery [phone]   Remember: ONLY 1 PERSON MAY GO WITH YOU TO SHORT STAY TO GET  READY MORNING OF [place].   Eat a light diet the day before surgery.  Examples including soups, broths, toast, yogurt, mashed potatoes.  Things to avoid include carbonated beverages (fizzy beverages), raw fruits and raw vegetables, or beans.   If your bowels are filled with gas, your surgeon will have difficulty visualizing your pelvic organs which increases your surgical risks.     Do not eat food or drink liquids :After Midnight.     Take these medicines the morning of surgery with A SIP OF WATER: NONE              You may not have any metal on your body including hair pins and              piercings  Do not wear jewelry, make-up, lotions, powders or perfumes, deodorant             Do not wear nail polish.  Do not shave  48 hours prior to surgery.              Men may shave face and neck.   Do not bring valuables to the hospital. [place].  Contacts, dentures or bridgework may not be worn into surgery.  Leave suitcase in the car. After surgery it may be brought to your room.     Patients discharged the day of surgery will not be allowed to drive home.  Name and phone number of your driver:  Special Instructions: N/A              Please read over the following fact sheets you were given: _____________________________________________________________________             [facility] - Preparing for Surgery Before surgery, you can play an important role.  Because skin is not sterile, your skin needs to be as free of germs as possible.  You can reduce the number of germs on  your skin by washing with CHG (chlorahexidine gluconate) soap before surgery.  CHG is an antiseptic cleaner which kills germs and bonds with the skin to continue killing germs even after washing. Please DO NOT use if you have an allergy to CHG or antibacterial soaps.  If your skin becomes reddened/irritated stop using the CHG and inform your nurse when you arrive at Short Stay. Do not shave (including legs and underarms) for at least 48 hours prior to the first CHG shower.  You may shave your face/neck. Please follow these instructions carefully:  1.  Shower with CHG Soap the night before surgery and the  morning of Surgery.  2.  If you choose to wash your hair, wash your hair first as usual with your  normal  shampoo.  3.  After you shampoo, rinse your hair and body thoroughly to remove the  shampoo.  4.  Use CHG as you would any other liquid soap.  You can apply chg directly  to the skin and wash                       Gently with a scrungie or clean washcloth.  5.  Apply the CHG Soap to your body ONLY FROM THE NECK DOWN.   Do not use on face/ open                           Wound or open sores. Avoid contact with eyes, ears mouth and genitals (private parts).                       Wash face,  Genitals (private parts) with your normal soap.             6.  Wash thoroughly, paying special attention to the area where your surgery  will be performed.  7.  Thoroughly rinse your body with warm water from the neck down.  8.  DO NOT shower/wash with your normal soap after using and rinsing off  the CHG Soap.                9.  Pat yourself dry with a clean towel.            10.  Wear clean pajamas.            11.  Place clean sheets on your bed the night of your first shower and do not  sleep with pets. Day of Surgery : Do not apply any lotions/deodorants the morning of surgery.  Please wear clean clothes to the hospital/surgery center.  FAILURE TO FOLLOW THESE INSTRUCTIONS MAY  RESULT IN THE CANCELLATION OF YOUR SURGERY PATIENT SIGNATURE_________________________________  NURSE SIGNATURE__________________________________  ________________________________________________________________________   Adam Phenix  An incentive spirometer is a tool that can help keep your lungs clear and active. This tool measures how well you are filling your lungs with each breath. Taking long deep breaths may help reverse or decrease the chance of developing breathing (pulmonary) problems (especially infection) following:  A long period of time when you are unable to move or be active. BEFORE THE PROCEDURE   If the spirometer includes an indicator to show your best effort, your nurse or respiratory therapist will set it to a desired goal.  If possible, sit up straight or lean slightly forward. Try not to slouch.  Hold the incentive spirometer in an upright position. INSTRUCTIONS FOR USE  1. Sit on the edge of your bed if possible, or sit up as far as you can in bed or on a chair. 2. Hold the incentive spirometer in an upright position. 3. Breathe out normally. 4. Place the mouthpiece in your mouth and seal your lips tightly around it. 5. Breathe in slowly and as deeply as possible, raising the piston or the ball toward the top of the column. 6. Hold your breath for 3-5 seconds or for as long as possible. Allow the piston or ball to fall to the bottom of the column. 7. Remove the mouthpiece from your mouth and breathe out normally. 8. Rest for a few seconds and repeat Steps 1 through 7 at least 10 times every 1-2 hours when you are awake. Take your time and take a few normal breaths between deep breaths. 9. The spirometer may include an indicator to show  your best effort. Use the indicator as a goal to work toward during each repetition. 10. After each set of 10 deep breaths, practice coughing to be sure your lungs are clear. If you have an incision (the cut made at the  time of surgery), support your incision when coughing by placing a pillow or rolled up towels firmly against it. Once you are able to get out of bed, walk around indoors and cough well. You may stop using the incentive spirometer when instructed by your caregiver.  RISKS AND COMPLICATIONS  Take your time so you do not get dizzy or light-headed.  If you are in pain, you may need to take or ask for pain medication before doing incentive spirometry. It is harder to take a deep breath if you are having pain. AFTER USE  Rest and breathe slowly and easily.  It can be helpful to keep track of a log of your progress. Your caregiver can provide you with a simple table to help with this. If you are using the spirometer at home, follow these instructions: [place] IF:   You are having difficultly using the spirometer.  You have trouble using the spirometer as often as instructed.  Your pain medication is not giving enough relief while using the spirometer.  You develop fever of 100.5 F (38.1 C) or higher. SEEK IMMEDIATE MEDICAL CARE IF:   You cough up bloody sputum that had not been present before.  You develop fever of 102 F (38.9 C) or greater.  You develop worsening pain at or near the incision site. MAKE SURE YOU:   Understand these instructions.  Will watch your condition.  Will get help right away if you are not doing well or get worse. Document Released: 05/07/2006 Document Revised: 03/19/2011 Document Reviewed: 07/08/2006 ExitCare Patient Information 2014 ExitCare, [place].   ________________________________________________________________________  WHAT IS A BLOOD TRANSFUSION? Blood Transfusion Information  A transfusion is the replacement of blood or some of its parts. Blood is made up of multiple cells which provide different functions.  Red blood cells carry oxygen and are used for blood loss replacement.  White blood cells fight against  infection.  Platelets control bleeding.  Plasma helps clot blood.  Other blood products are available for specialized needs, such as hemophilia or other clotting disorders. BEFORE THE TRANSFUSION  Who gives blood for transfusions?   Healthy volunteers who are fully evaluated to make sure their blood is safe. This is blood bank blood. Transfusion therapy is the safest it has ever been in the practice of medicine. Before blood is taken from a donor, a complete history is taken to make sure that person has no history of diseases nor engages in risky social behavior (examples are intravenous drug use or sexual activity with multiple partners). The donor's travel history is screened to minimize risk of transmitting infections, such as malaria. The donated blood is tested for signs of infectious diseases, such as HIV and hepatitis. The blood is then tested to be sure it is compatible with you in order to minimize the chance of a transfusion reaction. If you or a relative donates blood, this is often done in anticipation of surgery and is not appropriate for emergency situations. It takes many days to process the donated blood. RISKS AND COMPLICATIONS Although transfusion therapy is very safe and saves many lives, the main dangers of transfusion include:   Getting an infectious disease.  Developing a transfusion reaction. This is an allergic reaction to  something in the blood you were given. Every precaution is taken to prevent this. The decision to have a blood transfusion has been considered carefully by your caregiver before blood is given. Blood is not given unless the benefits outweigh the risks. AFTER THE TRANSFUSION  Right after receiving a blood transfusion, you will usually feel much better and more energetic. This is especially true if your red blood cells have gotten low (anemic). The transfusion raises the level of the red blood cells which carry oxygen, and this usually causes an energy  increase.  The nurse administering the transfusion will monitor you carefully for complications. HOME CARE INSTRUCTIONS  No special instructions are needed after a transfusion. You may find your energy is better. Speak with your caregiver about any limitations on activity for underlying diseases you may have. SEEK MEDICAL CARE IF:   Your condition is not improving after your transfusion.  You develop redness or irritation at the intravenous (IV) site. SEEK IMMEDIATE MEDICAL CARE IF:  Any of the following symptoms occur over the next 12 hours:  Shaking chills.  You have a temperature by mouth above 102 F (38.9 C), not controlled by medicine.  Chest, back, or muscle pain.  People around you feel you are not acting correctly or are confused.  Shortness of breath or difficulty breathing.  Dizziness and fainting.  You get a rash or develop hives.  You have a decrease in urine output.  Your urine turns a dark color or changes to pink, red, or brown. Any of the following symptoms occur over the next 10 days:  You have a temperature by mouth above 102 F (38.9 C), not controlled by medicine.  Shortness of breath.  Weakness after normal activity.  The white part of the eye turns yellow (jaundice).  You have a decrease in the amount of urine or are urinating less often.  Your urine turns a dark color or changes to pink, red, or brown. Document Released: 12/23/1999 Document Revised: 03/19/2011 Document Reviewed: 08/11/2007 [facility] Patient Information 2014 [place], [place].  _______________________________________________________________________

## 2016-01-30 ENCOUNTER — Encounter (HOSPITAL_COMMUNITY)
Admission: RE | Admit: 2016-01-30 | Discharge: 2016-01-30 | Disposition: A | Discharge status: Home / Self Care | Payer: Medicare Other | Source: Ambulatory Visit | Attending: Gynecologic Oncology | Admitting: Gynecologic Oncology

## 2016-01-30 ENCOUNTER — Encounter (HOSPITAL_COMMUNITY): Payer: Self-pay

## 2016-01-30 DIAGNOSIS — Z800 Family history of malignant neoplasm of digestive organs: Secondary | ICD-10-CM | POA: Diagnosis not present

## 2016-01-30 DIAGNOSIS — Z8249 Family history of ischemic heart disease and other diseases of the circulatory system: Secondary | ICD-10-CM | POA: Diagnosis not present

## 2016-01-30 DIAGNOSIS — F329 Major depressive disorder, single episode, unspecified: Secondary | ICD-10-CM | POA: Diagnosis not present

## 2016-01-30 DIAGNOSIS — F17200 Nicotine dependence, unspecified, uncomplicated: Secondary | ICD-10-CM | POA: Diagnosis not present

## 2016-01-30 DIAGNOSIS — E785 Hyperlipidemia, unspecified: Secondary | ICD-10-CM | POA: Diagnosis not present

## 2016-01-30 DIAGNOSIS — Z01812 Encounter for preprocedural laboratory examination: Secondary | ICD-10-CM | POA: Insufficient documentation

## 2016-01-30 DIAGNOSIS — D271 Benign neoplasm of left ovary: Secondary | ICD-10-CM | POA: Diagnosis not present

## 2016-01-30 DIAGNOSIS — N9489 Other specified conditions associated with female genital organs and menstrual cycle: Secondary | ICD-10-CM | POA: Diagnosis not present

## 2016-01-30 DIAGNOSIS — R1900 Intra-abdominal and pelvic swelling, mass and lump, unspecified site: Secondary | ICD-10-CM | POA: Insufficient documentation

## 2016-01-30 DIAGNOSIS — Z91048 Other nonmedicinal substance allergy status: Secondary | ICD-10-CM | POA: Diagnosis not present

## 2016-01-30 DIAGNOSIS — Z90710 Acquired absence of both cervix and uterus: Secondary | ICD-10-CM | POA: Diagnosis not present

## 2016-01-30 DIAGNOSIS — N83312 Acquired atrophy of left ovary: Secondary | ICD-10-CM | POA: Diagnosis not present

## 2016-01-30 DIAGNOSIS — N7011 Chronic salpingitis: Secondary | ICD-10-CM | POA: Diagnosis not present

## 2016-01-30 LAB — COMPREHENSIVE METABOLIC PANEL
ALBUMIN: 4.9 g/dL (ref 3.5–5.0)
ALK PHOS: 60 U/L (ref 38–126)
ALT: 26 U/L (ref 14–54)
AST: 27 U/L (ref 15–41)
Anion gap: 8 (ref 5–15)
BILIRUBIN TOTAL: 0.5 mg/dL (ref 0.3–1.2)
BUN: 13 mg/dL (ref 6–20)
CALCIUM: 9.5 mg/dL (ref 8.9–10.3)
CO2: 27 mmol/L (ref 22–32)
CREATININE: 0.71 mg/dL (ref 0.44–1.00)
Chloride: 104 mmol/L (ref 101–111)
GFR calc Af Amer: >60 mL/min (ref >60)
Glucose, Bld: 98 mg/dL (ref 65–99)
Potassium: 5.2 mmol/L — ABNORMAL HIGH (ref 3.5–5.1)
Sodium: 139 mmol/L (ref 135–145)
TOTAL PROTEIN: 7.5 g/dL (ref 6.5–8.1)

## 2016-01-30 LAB — CBC WITH DIFFERENTIAL/PLATELET
BASOS ABS: 0.1 10*3/uL (ref 0.0–0.1)
BASOS PCT: 1 %
Eosinophils Absolute: 0.3 10*3/uL (ref 0.0–0.7)
Eosinophils Relative: 4 %
HEMATOCRIT: 39.1 % (ref 36.0–46.0)
Hemoglobin: 12.9 g/dL (ref 12.0–15.0)
LYMPHS PCT: 29 %
Lymphs Abs: 2.2 10*3/uL (ref 0.7–4.0)
MCH: 31.9 pg (ref 26.0–34.0)
MCHC: 33 g/dL (ref 30.0–36.0)
MCV: 96.5 fL (ref 78.0–100.0)
MONO ABS: 0.6 10*3/uL (ref 0.1–1.0)
Monocytes Relative: 7 %
NEUTROS ABS: 4.4 10*3/uL (ref 1.7–7.7)
Neutrophils Relative %: 59 %
Platelets: 319 10*3/uL (ref 150–400)
RBC: 4.05 MIL/uL (ref 3.87–5.11)
RDW: 12.9 % (ref 11.5–15.5)
WBC: 7.5 10*3/uL (ref 4.0–10.5)

## 2016-01-30 LAB — URINALYSIS, ROUTINE W REFLEX MICROSCOPIC
Bacteria, UA: NONE SEEN
Bilirubin Urine: NEGATIVE
GLUCOSE, UA: NEGATIVE mg/dL
Ketones, ur: NEGATIVE mg/dL
Leukocytes, UA: NEGATIVE
Nitrite: NEGATIVE
PROTEIN: NEGATIVE mg/dL
SQUAMOUS EPITHELIAL / LPF: NONE SEEN
Specific Gravity, Urine: 1.006 (ref 1.005–1.030)
pH: 5 (ref 5.0–8.0)

## 2016-01-31 LAB — ABO/RH: ABO/RH(D): A POS

## 2016-01-31 NOTE — Progress Notes (Signed)
12/16/2015- noted in EPIC- CT abd. Pelvis w/contrast

## 2016-02-01 NOTE — Anesthesia Preprocedure Evaluation (Signed)
Anesthesia Evaluation  Patient identified by MRN, date of birth, ID band Patient awake    Reviewed: Allergy & Precautions, NPO status , Patient's Chart, lab work & pertinent test results  Airway Mallampati: II  TM Distance: >3 FB Neck ROM: Full    Dental no notable dental hx.    Pulmonary former smoker,    Pulmonary exam normal breath sounds clear to auscultation       Cardiovascular negative cardio ROS Normal cardiovascular exam(-) Valvular Problems/Murmurs Rhythm:Regular Rate:Normal     Neuro/Psych PSYCHIATRIC DISORDERS Depression negative neurological ROS     GI/Hepatic negative GI ROS, Neg liver ROS,   Endo/Other  negative endocrine ROS  Renal/GU negative Renal ROS     Musculoskeletal negative musculoskeletal ROS (+)   Abdominal   Peds  Hematology negative hematology ROS (+)   Anesthesia Other Findings   Reproductive/Obstetrics negative OB ROS                             Anesthesia Physical Anesthesia Plan  ASA: II  Anesthesia Plan: General   Post-op Pain Management:    Induction: Intravenous  Airway Management Planned: Oral ETT  Additional Equipment:   Intra-op Plan:   Post-operative Plan: Extubation in OR  Informed Consent: I have reviewed the patients History and Physical, chart, labs and discussed the procedure including the risks, benefits and alternatives for the proposed anesthesia with the patient or authorized representative who has indicated his/her understanding and acceptance.   Dental advisory given  Plan Discussed with: CRNA  Anesthesia Plan Comments:         Anesthesia Quick Evaluation

## 2016-02-02 ENCOUNTER — Encounter (HOSPITAL_COMMUNITY): Payer: Self-pay | Admitting: *Deleted

## 2016-02-02 ENCOUNTER — Inpatient Hospital Stay (HOSPITAL_COMMUNITY): Payer: Medicare Other | Admitting: Anesthesiology

## 2016-02-02 ENCOUNTER — Ambulatory Visit (HOSPITAL_COMMUNITY)
Admission: RE | Admit: 2016-02-02 | Discharge: 2016-02-03 | Disposition: A | Discharge status: Home / Self Care | Payer: Medicare Other | Source: Ambulatory Visit | Attending: Gynecologic Oncology | Admitting: Gynecologic Oncology

## 2016-02-02 ENCOUNTER — Encounter (HOSPITAL_COMMUNITY)
Admission: RE | Disposition: A | Discharge status: Home / Self Care | Payer: Self-pay | Source: Ambulatory Visit | Attending: Gynecologic Oncology

## 2016-02-02 DIAGNOSIS — F17200 Nicotine dependence, unspecified, uncomplicated: Secondary | ICD-10-CM | POA: Diagnosis not present

## 2016-02-02 DIAGNOSIS — Z800 Family history of malignant neoplasm of digestive organs: Secondary | ICD-10-CM | POA: Diagnosis not present

## 2016-02-02 DIAGNOSIS — E785 Hyperlipidemia, unspecified: Secondary | ICD-10-CM | POA: Insufficient documentation

## 2016-02-02 DIAGNOSIS — N7011 Chronic salpingitis: Secondary | ICD-10-CM | POA: Insufficient documentation

## 2016-02-02 DIAGNOSIS — N83312 Acquired atrophy of left ovary: Secondary | ICD-10-CM | POA: Diagnosis not present

## 2016-02-02 DIAGNOSIS — Z90710 Acquired absence of both cervix and uterus: Secondary | ICD-10-CM | POA: Insufficient documentation

## 2016-02-02 DIAGNOSIS — R1900 Intra-abdominal and pelvic swelling, mass and lump, unspecified site: Secondary | ICD-10-CM | POA: Diagnosis present

## 2016-02-02 DIAGNOSIS — Z91048 Other nonmedicinal substance allergy status: Secondary | ICD-10-CM | POA: Insufficient documentation

## 2016-02-02 DIAGNOSIS — F329 Major depressive disorder, single episode, unspecified: Secondary | ICD-10-CM | POA: Insufficient documentation

## 2016-02-02 DIAGNOSIS — N9489 Other specified conditions associated with female genital organs and menstrual cycle: Secondary | ICD-10-CM | POA: Insufficient documentation

## 2016-02-02 DIAGNOSIS — Z8249 Family history of ischemic heart disease and other diseases of the circulatory system: Secondary | ICD-10-CM | POA: Insufficient documentation

## 2016-02-02 DIAGNOSIS — D270 Benign neoplasm of right ovary: Secondary | ICD-10-CM

## 2016-02-02 DIAGNOSIS — D271 Benign neoplasm of left ovary: Principal | ICD-10-CM | POA: Insufficient documentation

## 2016-02-02 HISTORY — PX: LAPAROTOMY: SHX154

## 2016-02-02 SURGERY — LAPAROTOMY, EXPLORATORY
Anesthesia: General

## 2016-02-02 MED ORDER — MIDAZOLAM HCL 2 MG/2ML IJ SOLN
INTRAMUSCULAR | Status: AC
  Filled 2016-02-02: qty 2

## 2016-02-02 MED ORDER — ONDANSETRON HCL 4 MG/2ML IJ SOLN
INTRAMUSCULAR | Status: AC
Start: 2016-02-02 — End: 2016-02-02
  Filled 2016-02-02: qty 2

## 2016-02-02 MED ORDER — CEFAZOLIN SODIUM-DEXTROSE 2-4 GM/100ML-% IV SOLN
INTRAVENOUS | Status: AC
  Filled 2016-02-02: qty 100

## 2016-02-02 MED ORDER — HYDROMORPHONE HCL 2 MG/ML IJ SOLN: 0.2000 mg | INTRAMUSCULAR | Status: DC | PRN

## 2016-02-02 MED ORDER — SENNOSIDES-DOCUSATE SODIUM 8.6-50 MG PO TABS
2.0000 | ORAL_TABLET | Freq: Every day | ORAL | Status: DC
  Administered 2016-02-02: 2 via ORAL
  Filled 2016-02-02: qty 2

## 2016-02-02 MED ORDER — KETOROLAC TROMETHAMINE 15 MG/ML IJ SOLN
15.0000 mg | Freq: Four times a day (QID) | INTRAMUSCULAR | Status: AC
  Administered 2016-02-02 – 2016-02-03 (×4): 15 mg via INTRAVENOUS
  Filled 2016-02-02 (×4): qty 1

## 2016-02-02 MED ORDER — ONDANSETRON HCL 4 MG/2ML IJ SOLN: 4.0000 mg | Freq: Four times a day (QID) | INTRAMUSCULAR | Status: DC | PRN

## 2016-02-02 MED ORDER — IBUPROFEN 800 MG PO TABS: 800.0000 mg | ORAL_TABLET | Freq: Three times a day (TID) | ORAL | Status: DC | PRN

## 2016-02-02 MED ORDER — 0.9 % SODIUM CHLORIDE (POUR BTL) OPTIME
TOPICAL | Status: DC | PRN
  Administered 2016-02-02: 2000 mL

## 2016-02-02 MED ORDER — KCL IN DEXTROSE-NACL 20-5-0.45 MEQ/L-%-% IV SOLN
INTRAVENOUS | Status: DC
  Administered 2016-02-02: 11:00:00 via INTRAVENOUS
  Filled 2016-02-02 (×2): qty 1000

## 2016-02-02 MED ORDER — OXYCODONE-ACETAMINOPHEN 5-325 MG PO TABS: 1.0000 | ORAL_TABLET | ORAL | Status: DC | PRN

## 2016-02-02 MED ORDER — SUGAMMADEX SODIUM 200 MG/2ML IV SOLN
INTRAVENOUS | Status: AC
  Filled 2016-02-02: qty 2

## 2016-02-02 MED ORDER — ONDANSETRON HCL 4 MG/2ML IJ SOLN
INTRAMUSCULAR | Status: DC | PRN
  Administered 2016-02-02: 4 mg via INTRAVENOUS

## 2016-02-02 MED ORDER — SUCCINYLCHOLINE CHLORIDE 200 MG/10ML IV SOSY
PREFILLED_SYRINGE | INTRAVENOUS | Status: AC
  Filled 2016-02-02: qty 10

## 2016-02-02 MED ORDER — HYDROMORPHONE HCL 1 MG/ML IJ SOLN
0.2500 mg | INTRAMUSCULAR | Status: DC | PRN
  Administered 2016-02-02 (×2): 0.5 mg via INTRAVENOUS

## 2016-02-02 MED ORDER — PROMETHAZINE HCL 25 MG/ML IJ SOLN: 6.2500 mg | INTRAMUSCULAR | Status: DC | PRN

## 2016-02-02 MED ORDER — EPHEDRINE SULFATE 50 MG/ML IJ SOLN
INTRAMUSCULAR | Status: DC | PRN
  Administered 2016-02-02: 10 mg via INTRAVENOUS

## 2016-02-02 MED ORDER — SODIUM CHLORIDE 0.9 % IJ SOLN
INTRAMUSCULAR | Status: AC
  Filled 2016-02-02: qty 50

## 2016-02-02 MED ORDER — BUPIVACAINE LIPOSOME 1.3 % IJ SUSP
20.0000 mL | Freq: Once | INTRAMUSCULAR | Status: DC
  Filled 2016-02-02: qty 20

## 2016-02-02 MED ORDER — LACTATED RINGERS IV SOLN: INTRAVENOUS | Status: DC

## 2016-02-02 MED ORDER — ROCURONIUM BROMIDE 50 MG/5ML IV SOSY
PREFILLED_SYRINGE | INTRAVENOUS | Status: AC
  Filled 2016-02-02: qty 5

## 2016-02-02 MED ORDER — DEXAMETHASONE SODIUM PHOSPHATE 10 MG/ML IJ SOLN
INTRAMUSCULAR | Status: DC | PRN
  Administered 2016-02-02: 10 mg via INTRAVENOUS

## 2016-02-02 MED ORDER — HYDROMORPHONE HCL 1 MG/ML IJ SOLN: 0.2000 mg | INTRAMUSCULAR | Status: DC | PRN

## 2016-02-02 MED ORDER — PROPOFOL 10 MG/ML IV BOLUS
INTRAVENOUS | Status: AC
  Filled 2016-02-02: qty 20

## 2016-02-02 MED ORDER — ROCURONIUM BROMIDE 50 MG/5ML IV SOSY
PREFILLED_SYRINGE | INTRAVENOUS | Status: DC | PRN
  Administered 2016-02-02: 40 mg via INTRAVENOUS

## 2016-02-02 MED ORDER — LIDOCAINE 2% (20 MG/ML) 5 ML SYRINGE
INTRAMUSCULAR | Status: DC | PRN
  Administered 2016-02-02: 100 mg via INTRAVENOUS

## 2016-02-02 MED ORDER — MEPERIDINE HCL 50 MG/ML IJ SOLN: 6.2500 mg | INTRAMUSCULAR | Status: DC | PRN

## 2016-02-02 MED ORDER — DEXAMETHASONE SODIUM PHOSPHATE 10 MG/ML IJ SOLN
INTRAMUSCULAR | Status: AC
  Filled 2016-02-02: qty 1

## 2016-02-02 MED ORDER — SODIUM CHLORIDE 0.9 % IJ SOLN
INTRAMUSCULAR | Status: AC
Start: 2016-02-02 — End: 2016-02-02
  Filled 2016-02-02: qty 50

## 2016-02-02 MED ORDER — MIDAZOLAM HCL 5 MG/5ML IJ SOLN
INTRAMUSCULAR | Status: DC | PRN
  Administered 2016-02-02 (×2): 1 mg via INTRAVENOUS

## 2016-02-02 MED ORDER — LACTATED RINGERS IV SOLN
INTRAVENOUS | Status: DC | PRN
  Administered 2016-02-02 (×2): via INTRAVENOUS

## 2016-02-02 MED ORDER — SODIUM CHLORIDE 0.9 % IJ SOLN
INTRAMUSCULAR | Status: DC | PRN
  Administered 2016-02-02: 20 mL

## 2016-02-02 MED ORDER — BUPIVACAINE HCL 0.25 % IJ SOLN
INTRAMUSCULAR | Status: DC | PRN
  Administered 2016-02-02: 20 mL

## 2016-02-02 MED ORDER — EPHEDRINE 5 MG/ML INJ
INTRAVENOUS | Status: AC
  Filled 2016-02-02: qty 10

## 2016-02-02 MED ORDER — BUPIVACAINE LIPOSOME 1.3 % IJ SUSP
INTRAMUSCULAR | Status: DC | PRN
  Administered 2016-02-02: 20 mL

## 2016-02-02 MED ORDER — ONDANSETRON HCL 4 MG PO TABS: 4.0000 mg | ORAL_TABLET | Freq: Four times a day (QID) | ORAL | Status: DC | PRN

## 2016-02-02 MED ORDER — FENTANYL CITRATE (PF) 100 MCG/2ML IJ SOLN
INTRAMUSCULAR | Status: AC
  Filled 2016-02-02: qty 2

## 2016-02-02 MED ORDER — GABAPENTIN 300 MG PO CAPS
600.0000 mg | ORAL_CAPSULE | Freq: Every day | ORAL | Status: AC
  Administered 2016-02-02: 600 mg via ORAL
  Filled 2016-02-02: qty 2

## 2016-02-02 MED ORDER — BUPIVACAINE HCL (PF) 0.25 % IJ SOLN
INTRAMUSCULAR | Status: AC
  Filled 2016-02-02: qty 30

## 2016-02-02 MED ORDER — FENTANYL CITRATE (PF) 100 MCG/2ML IJ SOLN
INTRAMUSCULAR | Status: AC
Start: 2016-02-02 — End: 2016-02-02
  Filled 2016-02-02: qty 4

## 2016-02-02 MED ORDER — SUGAMMADEX SODIUM 200 MG/2ML IV SOLN
INTRAVENOUS | Status: DC | PRN
  Administered 2016-02-02: 150 mg via INTRAVENOUS

## 2016-02-02 MED ORDER — HYDROMORPHONE HCL 1 MG/ML IJ SOLN
INTRAMUSCULAR | Status: AC
  Filled 2016-02-02: qty 1

## 2016-02-02 MED ORDER — PROPOFOL 10 MG/ML IV BOLUS
INTRAVENOUS | Status: DC | PRN
  Administered 2016-02-02: 150 mg via INTRAVENOUS

## 2016-02-02 MED ORDER — CEFAZOLIN SODIUM-DEXTROSE 2-4 GM/100ML-% IV SOLN
2.0000 g | INTRAVENOUS | Status: AC
  Administered 2016-02-02: 2 g via INTRAVENOUS

## 2016-02-02 MED ORDER — ENOXAPARIN SODIUM 40 MG/0.4ML ~~LOC~~ SOLN
40.0000 mg | SUBCUTANEOUS | Status: AC
  Administered 2016-02-02: 40 mg via SUBCUTANEOUS
  Filled 2016-02-02: qty 0.4

## 2016-02-02 MED ORDER — SUCCINYLCHOLINE CHLORIDE 200 MG/10ML IV SOSY
PREFILLED_SYRINGE | INTRAVENOUS | Status: DC | PRN
  Administered 2016-02-02: 100 mg via INTRAVENOUS

## 2016-02-02 MED ORDER — LIDOCAINE 2% (20 MG/ML) 5 ML SYRINGE
INTRAMUSCULAR | Status: AC
Start: 2016-02-02 — End: 2016-02-02
  Filled 2016-02-02: qty 5

## 2016-02-02 MED ORDER — FENTANYL CITRATE (PF) 100 MCG/2ML IJ SOLN
INTRAMUSCULAR | Status: DC | PRN
  Administered 2016-02-02 (×3): 50 ug via INTRAVENOUS
  Administered 2016-02-02: 100 ug via INTRAVENOUS
  Administered 2016-02-02: 50 ug via INTRAVENOUS

## 2016-02-02 SURGICAL SUPPLY — 58 items
ADH SKN CLS APL DERMABOND .7 (GAUZE/BANDAGES/DRESSINGS) ×2
AGENT HMST MTR 8 SURGIFLO (HEMOSTASIS)
ATTRACTOMAT 16X20 MAGNETIC DRP (DRAPES) IMPLANT
BLADE EXTENDED COATED 6.5IN (ELECTRODE) ×3 IMPLANT
CELLS DAT CNTRL 66122 CELL SVR (MISCELLANEOUS) ×2 IMPLANT
CHLORAPREP W/TINT 26ML (MISCELLANEOUS) ×3 IMPLANT
CLIP TI LARGE 6 (CLIP) ×3 IMPLANT
CLIP TI MEDIUM 6 (CLIP) ×3 IMPLANT
CLIP TI MEDIUM LARGE 6 (CLIP) ×3 IMPLANT
CONT SPEC 4OZ CLIKSEAL STRL BL (MISCELLANEOUS) ×2 IMPLANT
COVER SURGICAL LIGHT HANDLE (MISCELLANEOUS) ×3 IMPLANT
DERMABOND ADVANCED (GAUZE/BANDAGES/DRESSINGS) ×1
DERMABOND ADVANCED .7 DNX12 (GAUZE/BANDAGES/DRESSINGS) ×1 IMPLANT
DRAPE INCISE IOBAN 66X45 STRL (DRAPES) IMPLANT
DRAPE WARM FLUID 44X44 (DRAPE) ×3 IMPLANT
ELECT REM PT RETURN 9FT ADLT (ELECTROSURGICAL) ×3
ELECTRODE REM PT RTRN 9FT ADLT (ELECTROSURGICAL) ×2 IMPLANT
GAUZE SPONGE 4X4 16PLY XRAY LF (GAUZE/BANDAGES/DRESSINGS) IMPLANT
GLOVE BIO SURGEON STRL SZ 6 (GLOVE) ×6 IMPLANT
GLOVE BIO SURGEON STRL SZ 6.5 (GLOVE) ×6 IMPLANT
GOWN STRL REUS W/ TWL LRG LVL3 (GOWN DISPOSABLE) ×4 IMPLANT
GOWN STRL REUS W/TWL LRG LVL3 (GOWN DISPOSABLE) ×6
HEMOSTAT ARISTA ABSORB 3G PWDR (MISCELLANEOUS) IMPLANT
KIT BASIN OR (CUSTOM PROCEDURE TRAY) ×3 IMPLANT
LIGASURE IMPACT 36 18CM CVD LR (INSTRUMENTS) ×2 IMPLANT
LOOP VESSEL MAXI BLUE (MISCELLANEOUS) IMPLANT
NEEDLE HYPO 22GX1.5 SAFETY (NEEDLE) ×6 IMPLANT
NS IRRIG 1000ML POUR BTL (IV SOLUTION) ×6 IMPLANT
PACK GENERAL/GYN (CUSTOM PROCEDURE TRAY) ×3 IMPLANT
RELOAD PROXIMATE 75MM BLUE (ENDOMECHANICALS) IMPLANT
RELOAD PROXIMATE TA60MM BLUE (ENDOMECHANICALS) IMPLANT
RELOAD STAPLE 60 BLU REG PROX (ENDOMECHANICALS) IMPLANT
RELOAD STAPLE 75 3.8 BLU REG (ENDOMECHANICALS) IMPLANT
RETRACTOR WND ALEXIS 18 MED (MISCELLANEOUS) IMPLANT
RETRACTOR WND ALEXIS 25 LRG (MISCELLANEOUS) IMPLANT
RTRCTR WOUND ALEXIS 18CM MED (MISCELLANEOUS) ×3
RTRCTR WOUND ALEXIS 25CM LRG (MISCELLANEOUS)
SHEET LAVH (DRAPES) ×3 IMPLANT
SPOGE SURGIFLO 8M (HEMOSTASIS)
SPONGE LAP 18X18 X RAY DECT (DISPOSABLE) IMPLANT
SPONGE SURGIFLO 8M (HEMOSTASIS) IMPLANT
STAPLER GUN LINEAR PROX 60 (STAPLE) IMPLANT
STAPLER PROXIMATE 75MM BLUE (STAPLE) IMPLANT
STAPLER VISISTAT 35W (STAPLE) IMPLANT
SUT PDS AB 1 TP1 96 (SUTURE) ×6 IMPLANT
SUT SILK 3 0 SH CR/8 (SUTURE) ×3 IMPLANT
SUT VIC AB 0 CT1 36 (SUTURE) ×12 IMPLANT
SUT VIC AB 2-0 CT1 36 (SUTURE) ×6 IMPLANT
SUT VIC AB 2-0 CT2 27 (SUTURE) ×18 IMPLANT
SUT VIC AB 3-0 CTX 36 (SUTURE) IMPLANT
SUT VIC AB 3-0 SH 18 (SUTURE) IMPLANT
SUT VIC AB 3-0 SH 27 (SUTURE) ×3
SUT VIC AB 3-0 SH 27X BRD (SUTURE) ×2 IMPLANT
SYR 30ML LL (SYRINGE) ×6 IMPLANT
TOWEL OR 17X26 10 PK STRL BLUE (TOWEL DISPOSABLE) ×3 IMPLANT
TOWEL OR NON WOVEN STRL DISP B (DISPOSABLE) ×3 IMPLANT
TRAY FOLEY W/METER SILVER 16FR (SET/KITS/TRAYS/PACK) ×3 IMPLANT
UNDERPAD 30X30 INCONTINENT (UNDERPADS AND DIAPERS) ×3 IMPLANT

## 2016-02-02 NOTE — Anesthesia Procedure Notes (Signed)
Procedure Name: Intubation Date/Time: 02/02/2016 7:35 AM Performed by: Maxwell Caul Pre-anesthesia Checklist: Patient identified, Emergency Drugs available, Suction available and Patient being monitored Patient Re-evaluated:Patient Re-evaluated prior to inductionOxygen Delivery Method: Circle system utilized Preoxygenation: Pre-oxygenation with 100% oxygen Intubation Type: IV induction Ventilation: Mask ventilation without difficulty Laryngoscope Size: Mac and 4 Grade View: Grade I Tube type: Oral Tube size: 7.5 mm Number of attempts: 1 Airway Equipment and Method: Stylet Placement Confirmation: ETT inserted through vocal cords under direct vision,  positive ETCO2 and breath sounds checked- equal and bilateral Secured at: 21 cm Tube secured with: Tape Dental Injury: Teeth and Oropharynx as per pre-operative assessment

## 2016-02-02 NOTE — Interval H&P Note (Signed)
History and Physical Interval Note:  02/02/2016 6:57 AM  Stacey Perry  has presented today for surgery, with the diagnosis of PELVIC MASS  The various methods of treatment have been discussed with the patient and family. After consideration of risks, benefits and other options for treatment, the patient has consented to  Procedure(s): EXPLORATORY LAPAROTOMY AND STAGING (N/A) SALPINGO OOPHORECTOMY (Bilateral) as a surgical intervention .  The patient's history has been reviewed, patient examined, no change in status, stable for surgery. CA 125 preop is normal. I have reviewed the patient's chart and labs.  Questions were answered to the patient's satisfaction.     [name]

## 2016-02-02 NOTE — H&P (View-Only) (Signed)
Consult Note: Gyn-Onc  Stacey Perry 66 y.o. female  CC:  Chief Complaint  Patient presents with  . pelvic mass    HPI: Patient is seen today in consultation at the request of Dr. Talbert Nan.  Patient is a 66 year old gravida 2 para 2 who went in to see her primary physician for an annual examination and a mass was appreciated. She had an ultrasound performed 12/05/2015. It revealed a large solid-appearing mass with posterior acoustic shadowing which measured 9.1 x 8.5 x 6.2 cm. It was thought to arise potentially from the right ovary but was difficult to confirm. The left ovary was normal measuring 2.2 x 1.6 x 1.9 cm. This was followed by CT scan of the abdomen and pelvis performed on 12/16/2015. The appendix was not well-visualized. The patient is status post hysterectomy. There's a large mildly complex mass lesion identified in the mid abdomen which measured 10.7 x 2.4 cm. It demonstrated some peripheral calcifications as well as some adjacent hypodense areas which may represent necrosis or possible adjacent cyst. There was no ascites. There is no mention of any omental caking. There were no enlarged abdominal or pelvic lymph nodes. No tumor markers have been drawn. The patient did have a colonoscopy December 15. It revealed tubular adenomas and hyperplastic colonic polyp. No high-grade dysplasia or malignancy identified. They recommended repeat colonoscopy in 3 years. She has a half brother who also had colon cancer in his 67s.  She states that even in retrospect she has not had any symptoms. She is sexually active and denies any dyspareunia. She denies any change in her bowel or bladder habits. She denies any nausea vomiting or other symptomatology.  She is up-to-date on her mammograms. She had one in December with a 6 month recall secondary to some calcifications. She is status post vaginal hysterectomy in 1991 secondary to heavy bleeding. She did take hormone replacement therapy for about a  year and also took birth control pills for a few years.  Review of Systems: Constitutional: Denies fever. Skin: No rash Cardiovascular: No chest pain, shortness of breath, or edema  Pulmonary: No cough  Gastro Intestinal: No nausea, vomiting, constipation, or diarrhea reported.  Genitourinary: No frequency Musculoskeletal: No joint swelling or pain.   Current Meds:  Outpatient Encounter Prescriptions as of 01/11/2016  Medication Sig  . Calcium Carb-Cholecalciferol (CALCIUM 1000 + D) 1000-800 MG-UNIT TABS Take 1 capsule by mouth daily.  . Cholecalciferol 4000 units CAPS Take 1 capsule by mouth daily.  . Cinnamon 500 MG TABS Take 1 tablet by mouth daily.  . Melatonin ER 5 MG TBCR Take 5 mg by mouth at bedtime.  . thiamine (VITAMIN B-1) 100 MG tablet Take 200 mg by mouth daily.  . vitamin C (ASCORBIC ACID) 500 MG tablet Take 500 mg by mouth daily.  . [DISCONTINUED] Biotin 5000 MCG TABS Take 1 tablet by mouth daily.   Facility-Administered Encounter Medications as of 01/11/2016  Medication  . 0.9 %  sodium chloride infusion    Allergy: No Known Allergies  Social Hx:   Social History   Social History  . Marital status: Married    Spouse name: N/A  . Number of children: N/A  . Years of education: N/A   Occupational History  . Not on file.   Social History Main Topics  . Smoking status: Former Research scientist (life sciences)  . Smokeless tobacco: Never Used  . Alcohol use Not on file  . Drug use: Unknown  . Sexual activity: Yes  Birth control/ protection: None     Comment: N/A   Other Topics Concern  . Not on file   Social History Narrative  . No narrative on file    Past Surgical Hx:  Past Surgical History:  Procedure Laterality Date  . ABDOMINAL HYSTERECTOMY     1991  . AUGMENTATION MAMMAPLASTY    . BREAST SURGERY     Augmentation (implants)  . TONSILLECTOMY  1957  . TUBAL LIGATION      Past Medical Hx:  Past Medical History:  Diagnosis Date  . Allergy   . Depression   .  Hyperlipidemia     Oncology Hx:   No history exists.    Family Hx:  Family History  Problem Relation Age of Onset  . Colon cancer Mother 32  . Heart disease Father   . Colon polyps Neg Hx   . Rectal cancer Neg Hx   . Stomach cancer Neg Hx     Vitals:  Pulse 75, resp. rate 18, height 5\' 7"  (1.702 m), weight 146 lb (66.2 kg), SpO2 98 %.  Physical Exam:  Well-nourished well-developed female in no acute distress.  Neck: Supple, no lymphadenopathy no thyromegaly.  Lungs: Clear to auscultation bilaterally.  Cardiac: Regular rate and rhythm.  Abdomen: Soft, nontender, nondistended. There are no palpable masses or hepatosplenomegaly. There is no fluid wave.  Groins: No lymphadenopathy.  Externally: No edema.  Pelvic: External genitalia within normal limits. Bimanual examination reveals a firm hard mass 4 fingerbreadths above the introitus. It is mobile. The mass is firm but there is no nodularity on rectovaginal examination.  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 She is completely asymptomatic. Secondary to what feels to be mostly a firm solid mass I do not believe that she would be a candidate for minimally invasive surgery. I would recommend that she undergo expiratory laparotomy bilateral salpingo-oophorectomy. I do think that the surgery could be done via low-transverse incision. She's tentatively scheduled for 02/02/2016 with Dr. Denman George. We do not have tumor markers. We'll obtain a CA-125 today.   Risks of surgery including but not limited to thromboembolic disease, bleeding, infection, injury to his running organs were reviewed with the patient and her husband. They understand that the masses be sent for frozen section and further surgery including staging will be based on that.  I did reassure them that I think that this is most likely a benign mass and that  the surgical date as provided is adequate.  Their questions were elicited in answer to their satisfaction.  We appreciate the opportunity to partner in the care of this very pleasant patient.  Yeimy Brabant A., MD 01/11/2016, 8:45 AM

## 2016-02-02 NOTE — Op Note (Signed)
OPERATIVE NOTE Date: 02/02/16 Preoperative Diagnosis:  Pelvic mass   Postoperative Diagnosis:right ovarian fibroma    Procedure(s) Performed: Exploratory laparotomy with bilateral salpingo-oophorectomy.  Surgeon: Thereasa Solo, MD.  Assistant Surgeon: Lahoma Crocker, M.D. Assistant: (an MD assistant was necessary for tissue manipulation, retraction and positioning due to the complexity of the case and hospital policies).   Specimens: washings, left and right tube and ovary   Estimated Blood Loss: 50 mL.    Urine [id number]  Complications: None.   Operative Findings: 15cm solid right ovarian mass. Fibroma on frozen section. Normal left tube and ovary (very small ovary). Normal upper abdomen and intestines.    Procedure:   The patient was seen in the Holding Room. The risks, benefits, complications, treatment options, and expected outcomes were discussed with the patient.  The patient concurred with the proposed plan, giving informed consent.   The patient was  identified as Stacey Perry and the procedure verified as USO, possible staging. A Time Out was held and the above information confirmed upon entry to the operating room.  After induction of anesthesia, the patient was draped and prepped in the usual sterile manner.  She was prepped and draped in the normal sterile fashion in the dorsal lithotomy position in padded Allen stirrups with good attention paid to support of the lower back and lower extremities. Position was adjusted for appropriate support. A Foley catheter was placed to gravity.   A low transverse incision was made and carried through the subcutaneous tissue to the fascia. The fascial incision was made and extended bilaterally. The rectus muscles were dissected off the fascia and separated in the midline. The peritoneum was identified and entered. Peritoneal incision was extended longitudinally.  The abdominal cavity was entered sharply and without incident. An Allexis  retractor was then placed. A survey of the abdomen and pelvis revealed the above findings, which were significant for 15cm ovarian mass arising from the right ovary with no ascites or extraovarian masses.  Washings were obtained from the peritoneal cavity.  An alexis retractor was placed.  The right retroperitoneum was opened. The ureter was identified in the broad ligament. A window was made in the medial leaf of the broad ligament above the level of the ureter. The right vaginal attachments and infundibulopelvic ligament were skeletonized and sealed and transected with the ligasure. This liberated the ovary from its attachments. It was sent for frozen section which revealed benign fibroma. Hemostasis was observed at the utero-ovarian and IP ligaments.  The retroperitoneum on the left was opened and the ureter was identified in the medial leaf of the broad ligament deep to the surgical pedicles.   The peritoneal cavity was irrigated and hemostasis was confirmed at all surgical sites.  The fascia was reapproximated with 0 looped PDS using a total of two sutures. The subcutaneous layer was then irrigated copiously.  Exparel long acting local anesthetic was infiltrated into the subcutaneous tissues. The skin was closed with subcuticular suture. The patient tolerated the procedure well.   Sponge, lap and needle counts were correct x 2.   [name], MD

## 2016-02-02 NOTE — Anesthesia Postprocedure Evaluation (Signed)
Anesthesia Post Note  Patient: Stacey Perry  Procedure(s) Performed: Procedure(s) (LRB): EXPLORATORY LAPAROTOMY BILATERAL SALPINGO OOPHERECTOMY AND PELVIC WASHINGS (N/A)  Patient location during evaluation: PACU Anesthesia Type: General Level of consciousness: sedated and patient cooperative Pain management: pain level controlled Vital Signs Assessment: post-procedure vital signs reviewed and stable Respiratory status: spontaneous breathing Cardiovascular status: stable Anesthetic complications: no       Last Vitals:  Vitals:   02/02/16 1230 02/02/16 1319  BP: 130/71 133/70  Pulse: 86 86  Resp: 18 18  Temp: 36.8 C 36.8 C    Last Pain:  Vitals:   02/02/16 1319  TempSrc: Oral  PainSc:                  Nolon Nations

## 2016-02-02 NOTE — Transfer of Care (Signed)
Immediate Anesthesia Transfer of Care Note  Patient: Stacey Perry  Procedure(s) Performed: Procedure(s): EXPLORATORY LAPAROTOMY BILATERAL SALPINGO OOPHERECTOMY AND PELVIC WASHINGS (N/A)  Patient Location: PACU  Anesthesia Type:General  Level of Consciousness:  sedated, patient cooperative and responds to stimulation  Airway & Oxygen Therapy:Patient Spontanous Breathing and Patient connected to face mask oxgen  Post-op Assessment:  Report given to PACU RN and Post -op Vital signs reviewed and stable  Post vital signs:  Reviewed and stable  Last Vitals:  Vitals:   02/02/16 0526  BP: (!) 142/52  Pulse: 80  Resp: 16  Temp: 123XX123 C    Complications: No apparent anesthesia complications

## 2016-02-03 DIAGNOSIS — D271 Benign neoplasm of left ovary: Secondary | ICD-10-CM | POA: Diagnosis not present

## 2016-02-03 DIAGNOSIS — N9489 Other specified conditions associated with female genital organs and menstrual cycle: Secondary | ICD-10-CM | POA: Diagnosis not present

## 2016-02-03 DIAGNOSIS — F17200 Nicotine dependence, unspecified, uncomplicated: Secondary | ICD-10-CM | POA: Diagnosis not present

## 2016-02-03 DIAGNOSIS — N83312 Acquired atrophy of left ovary: Secondary | ICD-10-CM | POA: Diagnosis not present

## 2016-02-03 DIAGNOSIS — N7011 Chronic salpingitis: Secondary | ICD-10-CM | POA: Diagnosis not present

## 2016-02-03 DIAGNOSIS — Z90710 Acquired absence of both cervix and uterus: Secondary | ICD-10-CM | POA: Diagnosis not present

## 2016-02-03 LAB — CBC
HEMATOCRIT: 33.7 % — AB (ref 36.0–46.0)
HEMOGLOBIN: 11.1 g/dL — AB (ref 12.0–15.0)
MCH: 32 pg (ref 26.0–34.0)
MCHC: 32.9 g/dL (ref 30.0–36.0)
MCV: 97.1 fL (ref 78.0–100.0)
Platelets: 279 10*3/uL (ref 150–400)
RBC: 3.47 MIL/uL — AB (ref 3.87–5.11)
RDW: 13.3 % (ref 11.5–15.5)
WBC: 9.6 10*3/uL (ref 4.0–10.5)

## 2016-02-03 LAB — BASIC METABOLIC PANEL
ANION GAP: 8 (ref 5–15)
BUN: 13 mg/dL (ref 6–20)
CALCIUM: 8.7 mg/dL — AB (ref 8.9–10.3)
CO2: 26 mmol/L (ref 22–32)
Chloride: 105 mmol/L (ref 101–111)
Creatinine, Ser: 0.81 mg/dL (ref 0.44–1.00)
GFR calc non Af Amer: >60 mL/min (ref >60)
Glucose, Bld: 104 mg/dL — ABNORMAL HIGH (ref 65–99)
POTASSIUM: 4 mmol/L (ref 3.5–5.1)
Sodium: 139 mmol/L (ref 135–145)

## 2016-02-03 MED ORDER — OXYCODONE-ACETAMINOPHEN 5-325 MG PO TABS: 1.0000 | ORAL_TABLET | ORAL | 0 refills | Status: DC | PRN

## 2016-02-03 NOTE — Discharge Instructions (Signed)
02/02/2016  Return to work: 4-6 weeks  Activity: 1. Be up and out of the bed during the day.  Take a nap if needed.  You may walk up steps but be careful and use the hand rail.  Stair climbing will tire you more than you think, you may need to stop part way and rest.   2. No lifting or straining for 6 weeks.  3. No driving for 1 weeks.  Do Not drive if you are taking narcotic pain medicine.  4. Shower daily.  Use soap and water on your incision and pat dry; don't rub.   5. No sexual activity and nothing in the vagina for 2 weeks.  Diet: 1. Low sodium Heart Healthy Diet is recommended.  2. It is safe to use a laxative if you have difficulty moving your bowels.   Wound Care: 1. Keep clean and dry.  Shower daily.  Reasons to call the Doctor:   Fever - Oral temperature greater than 100.4 degrees Fahrenheit  Foul-smelling vaginal discharge  Difficulty urinating  Nausea and vomiting  Increased pain at the site of the incision that is unrelieved with pain medicine.  Difficulty breathing with or without chest pain  New calf pain especially if only on one side  Sudden, continuing increased vaginal bleeding with or without clots.   Follow-up: 1. See Everitt Amber in 3 weeks.  Contacts: For questions or concerns you should contact:  Dr. Everitt Amber at [phone]  or at [place]

## 2016-02-03 NOTE — Discharge Summary (Signed)
Physician Discharge Summary  Patient ID: Stacey Perry MRN: [id number] DOB/AGE: 05/01/1950 66 y.o.  Admit date: 02/02/2016 Discharge date: 02/03/2016  Admission Diagnoses: Pelvic mass  Discharge Diagnoses:  Principal Problem:   Pelvic mass Active Problems:   Pelvic mass in female   Discharged Condition:  The patient is in good condition and stable for discharge.    Hospital Course: On 02/02/2016, the patient underwent the following: Procedure(s): EXPLORATORY LAPAROTOMY BILATERAL SALPINGO OOPHERECTOMY AND PELVIC WASHINGS.   The postoperative course was uneventful.  She was discharged to home on postoperative day 1 tolerating a regular diet, voiding, passing flatus, minimal pain.  Consults: None  Significant Diagnostic Studies: None  Treatments: surgery: see above  Discharge Exam: Blood pressure 132/74, pulse 85, temperature 97.6 F (36.4 C), temperature source Oral, resp. rate 16, height 5\' 6"  (1.676 m), weight 144 lb (65.3 kg), SpO2 99 %. General appearance: alert, cooperative, appears stated age and no distress Resp: clear to auscultation bilaterally Cardio: regular rate and rhythm, S1, S2 normal, no murmur, click, rub or gallop GI: soft, non-tender; bowel sounds normal; no masses,  no organomegaly Extremities: extremities normal, atraumatic, no cyanosis or edema Incision/Wound: Low transverse incision with dermabond without erythema or drainage  Disposition: Home  Discharge Instructions    Call MD for:  difficulty breathing, headache or visual disturbances    Complete by:  As directed    Call MD for:  extreme fatigue    Complete by:  As directed    Call MD for:  hives    Complete by:  As directed    Call MD for:  persistant dizziness or light-headedness    Complete by:  As directed    Call MD for:  persistant nausea and vomiting    Complete by:  As directed    Call MD for:  redness, tenderness, or signs of infection (pain, swelling, redness, odor or green/yellow  discharge around incision site)    Complete by:  As directed    Call MD for:  severe uncontrolled pain    Complete by:  As directed    Call MD for:  temperature >100.4    Complete by:  As directed    Diet - low sodium heart healthy    Complete by:  As directed    Driving Restrictions    Complete by:  As directed    No driving for 1 weeks.  Do not take narcotics and drive.   Increase activity slowly    Complete by:  As directed    Lifting restrictions    Complete by:  As directed    No lifting greater than 10 lbs.   Sexual Activity Restrictions    Complete by:  As directed    No sexual activity, nothing in the vagina, for 2 weeks.     Allergies as of 02/03/2016      Reactions   Latex Other (See Comments)   ? Latex allergy- itching with adhesive tape   Tape    BANDAIDS-cause itching, bruising, skin peels off ,       Medication List    TAKE these medications   Biotin 5000 MCG Tabs Take 1 tablet by mouth daily.   CALCIUM 1000 + D 1000-800 MG-UNIT Tabs Generic drug:  Calcium Carb-Cholecalciferol Take 1 capsule by mouth daily.   Cinnamon 500 MG Tabs Take 1 tablet by mouth daily.   Magnesium Oxide 250 MG Tabs Take 1 tablet by mouth daily.   Melatonin ER 5 MG  Tbcr Take 5 mg by mouth at bedtime.   oxyCODONE-acetaminophen 5-325 MG tablet Commonly known as:  PERCOCET/ROXICET Take 1-2 tablets by mouth every 4 (four) hours as needed (moderate to severe pain).   thiamine 100 MG tablet Commonly known as:  VITAMIN B-1 Take 200 mg by mouth daily.   vitamin C 500 MG tablet Commonly known as:  ASCORBIC ACID Take 500 mg by mouth daily.   VITAMIN D (CHOLECALCIFEROL) PO Take 500 Units by mouth daily.   Vitamin K (Phytonadione) 100 MCG Tabs Take 200 mcg by mouth daily.   zinc gluconate 50 MG tablet Take 50 mg by mouth daily.      Follow-up Information    Stacey Eva, MD Follow up on 02/20/2016.   Specialty:  Obstetrics and Gynecology Why:  at 3:15pm at the  [facility] information: [place] [id number] [phone]           Greater than thirty minutes were spend for face to face discharge instructions and discharge orders/summary in EPIC.   Signed: [name] 02/03/2016, 8:30 AM

## 2016-02-06 ENCOUNTER — Telehealth: Payer: Self-pay | Admitting: Gynecologic Oncology

## 2016-02-06 NOTE — Telephone Encounter (Signed)
Post op telephone call to check patient status.  Patient describes expected post operative status.  Adequate PO intake reported.  Bowels and bladder functioning without difficulty.  Pain minimal.  Reportable signs and symptoms reviewed.  Follow up appt previously arranged.

## 2016-02-08 LAB — TYPE AND SCREEN
ABO/RH(D): A POS
Antibody Screen: NEGATIVE

## 2016-02-20 ENCOUNTER — Encounter: Payer: Self-pay | Admitting: Gynecologic Oncology

## 2016-02-20 ENCOUNTER — Ambulatory Visit: Payer: Medicare Other | Attending: Gynecologic Oncology | Admitting: Gynecologic Oncology

## 2016-02-20 VITALS — BP 110/62 | HR 85 | Temp 98.2°F | Resp 18 | Ht 66.0 in | Wt 141.0 lb

## 2016-02-20 DIAGNOSIS — Z9889 Other specified postprocedural states: Secondary | ICD-10-CM | POA: Insufficient documentation

## 2016-02-20 DIAGNOSIS — F329 Major depressive disorder, single episode, unspecified: Secondary | ICD-10-CM | POA: Insufficient documentation

## 2016-02-20 DIAGNOSIS — E785 Hyperlipidemia, unspecified: Secondary | ICD-10-CM | POA: Diagnosis not present

## 2016-02-20 DIAGNOSIS — Z79899 Other long term (current) drug therapy: Secondary | ICD-10-CM | POA: Insufficient documentation

## 2016-02-20 DIAGNOSIS — Z87891 Personal history of nicotine dependence: Secondary | ICD-10-CM | POA: Diagnosis not present

## 2016-02-20 DIAGNOSIS — D270 Benign neoplasm of right ovary: Secondary | ICD-10-CM | POA: Diagnosis not present

## 2016-02-20 DIAGNOSIS — Z800 Family history of malignant neoplasm of digestive organs: Secondary | ICD-10-CM | POA: Diagnosis not present

## 2016-02-20 DIAGNOSIS — Z888 Allergy status to other drugs, medicaments and biological substances status: Secondary | ICD-10-CM | POA: Insufficient documentation

## 2016-02-20 DIAGNOSIS — R1900 Intra-abdominal and pelvic swelling, mass and lump, unspecified site: Secondary | ICD-10-CM

## 2016-02-20 DIAGNOSIS — Z90710 Acquired absence of both cervix and uterus: Secondary | ICD-10-CM | POA: Diagnosis not present

## 2016-02-20 DIAGNOSIS — Z90722 Acquired absence of ovaries, bilateral: Secondary | ICD-10-CM

## 2016-02-20 DIAGNOSIS — Z8371 Family history of colonic polyps: Secondary | ICD-10-CM | POA: Diagnosis not present

## 2016-02-20 NOTE — Progress Notes (Signed)
Consult Note: Gyn-Onc  Stacey Perry 66 y.o. female  CC:  Chief Complaint  Patient presents with  . ovarian mass   Assessment/Plan: 66 year old with a history of ex lap, BSO for a right ovarian fibroma.   We discussed the benign nature of her pathology. No further intervention is required for this.  I recommend annual well-woman follow-up with Dr Talbert Nan.  She can return to see [place] here on a prn basis.  HPI: Patient is seen today for follow-up.  Patient is a 66 year old gravida 2 para 2 who went in to see her primary physician for an annual examination and a mass was appreciated. She had an ultrasound performed 12/05/2015. It revealed a large solid-appearing mass with posterior acoustic shadowing which measured 9.1 x 8.5 x 6.2 cm. It was thought to arise potentially from the right ovary but was difficult to confirm. The left ovary was normal measuring 2.2 x 1.6 x 1.9 cm. This was followed by CT scan of the abdomen and pelvis performed on 12/16/2015. The appendix was not well-visualized. The patient is status post hysterectomy. There's a large mildly complex mass lesion identified in the mid abdomen which measured 10.7 x 2.4 cm. It demonstrated some peripheral calcifications as well as some adjacent hypodense areas which may represent necrosis or possible adjacent cyst. There was no ascites. There is no mention of any omental caking. There were no enlarged abdominal or pelvic lymph nodes. No tumor markers have been drawn. The patient did have a colonoscopy December 15. It revealed tubular adenomas and hyperplastic colonic polyp. No high-grade dysplasia or malignancy identified. They recommended repeat colonoscopy in 3 years. She has a half brother who also had colon cancer in his 73s.  She states that even in retrospect she has not had any symptoms. She is sexually active and denies any dyspareunia. She denies any change in her bowel or bladder habits. She denies any nausea vomiting or other  symptomatology.  She is up-to-date on her mammograms. She had one in December with a 6 month recall secondary to some calcifications. She is status post vaginal hysterectomy in 1991 secondary to heavy bleeding. She did take hormone replacement therapy for about a year and also took birth control pills for a few years.  Interval Hx:  On 02/02/16 she underwent ex lap via pfannenstiel incision with BSO. Surgery was uncomplicated. 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 She was discharged to home on POD 1. Since surgery she has felt very good with no complaints.  Review of Systems: Constitutional: Denies fever. Skin: No rash Cardiovascular: No chest pain, shortness of breath, or edema  Pulmonary: No cough  Gastro Intestinal: No nausea, vomiting, constipation, or diarrhea reported.  Genitourinary: No frequency Musculoskeletal: No joint swelling or pain.   Current Meds:  Outpatient Encounter Prescriptions as of 02/20/2016  Medication Sig  . Biotin 5000 MCG TABS Take 1 tablet by mouth daily.  . Calcium Carb-Cholecalciferol (CALCIUM 1000 + D) 1000-800 MG-UNIT TABS Take 1 capsule by mouth daily.  . Cinnamon 500 MG TABS Take 1 tablet by mouth daily.  . Magnesium Oxide 250 MG TABS Take 1 tablet by mouth daily.  . Melatonin ER 5 MG TBCR Take 5 mg by mouth at bedtime.  . thiamine (VITAMIN B-1) 100 MG tablet Take 200 mg by mouth daily.  . vitamin C (ASCORBIC ACID) 500 MG tablet Take 500 mg by mouth daily.  Marland Kitchen VITAMIN D, CHOLECALCIFEROL, PO Take 500  Units by mouth daily.  . Vitamin K, Phytonadione, 100 MCG TABS Take 200 mcg by mouth daily.  Marland Kitchen zinc gluconate 50 MG tablet Take 50 mg by mouth daily.  . [DISCONTINUED] oxyCODONE-acetaminophen (PERCOCET/ROXICET) 5-325 MG tablet Take 1-2 tablets by mouth every 4 (four) hours as needed (moderate to severe pain). (Patient not taking: Reported on 02/20/2016)   No facility-administered encounter medications on  file as of 02/20/2016.     Allergy:  Allergies  Allergen Reactions  . Latex Other (See Comments)    ? Latex allergy- itching with adhesive tape  . Tape     BANDAIDS-cause itching, bruising, skin peels off ,     Social Hx:   Social History   Social History  . Marital status: Married    Spouse name: N/A  . Number of children: N/A  . Years of education: N/A   Occupational History  . Not on file.   Social History Main Topics  . Smoking status: Former Research scientist (life sciences)  . Smokeless tobacco: Never Used  . Alcohol use No  . Drug use: No  . Sexual activity: Yes    Birth control/ protection: None     Comment: N/A   Other Topics Concern  . Not on file   Social History Narrative  . No narrative on file    Past Surgical Hx:  Past Surgical History:  Procedure Laterality Date  . ABDOMINAL HYSTERECTOMY  1991   ovaries were left  . AUGMENTATION MAMMAPLASTY    . BREAST SURGERY     Augmentation (implants)  . LAPAROTOMY N/A 02/02/2016   Procedure: EXPLORATORY LAPAROTOMY BILATERAL SALPINGO OOPHERECTOMY AND PELVIC WASHINGS;  Surgeon: Everitt Amber, MD;  Location: [facility];  Service: Gynecology;  Laterality: N/A;  . TONSILLECTOMY  1957  . TUBAL LIGATION      Past Medical Hx:  Past Medical History:  Diagnosis Date  . Allergy   . Depression   . Hyperlipidemia     Oncology Hx:   No history exists.    Family Hx:  Family History  Problem Relation Age of Onset  . Colon cancer Mother 38  . Heart disease Father   . Colon polyps Neg Hx   . Rectal cancer Neg Hx   . Stomach cancer Neg Hx     Vitals:  Blood pressure 110/62, pulse 85, temperature 98.2 F (36.8 C), temperature source Oral, resp. rate 18, height 5\' 6"  (1.676 m), weight 141 lb (64 kg), SpO2 100 %.  Physical Exam:  Well-nourished well-developed female in no acute distress.  Neck: Supple, no lymphadenopathy no thyromegaly.  Lungs: Clear to auscultation bilaterally.  Cardiac: Regular rate and rhythm.  Abdomen: Soft,  nontender, nondistended. There are no palpable masses or hepatosplenomegaly. There is no fluid wave. Incision well healed. Skin glue peeling off.  Groins: No lymphadenopathy.  Externally: No edema.  Pelvic: deferred  [name], MD 02/20/2016, 3:30 PM

## 2016-02-20 NOTE — Patient Instructions (Signed)
You can follow-up with Dr Talbert Nan annually for well woman checks. Neosporin can aid in dissolving the skin glue. You can take tub baths. Gentle exercise (walking) only for 1 more week, and more vigorous exercise after 1 week.

## 2016-09-27 ENCOUNTER — Encounter: Payer: Self-pay | Admitting: Family Medicine

## 2016-10-12 ENCOUNTER — Ambulatory Visit: Payer: Medicare Other

## 2017-12-02 IMAGING — MG DIGITAL DIAGNOSTIC BILATERAL MAMMOGRAM WITH CAD
8 series · 8 of 12 positions shown · non-contrast
Comparison: Screening mammogram dated 12/05/2015.

CLINICAL DATA: Screening recall for bilateral breast calcifications
and left breast mass.

EXAM:
DIGITAL DIAGNOSTIC BILATERAL MAMMOGRAM WITH CAD
ULTRASOUND LEFT BREAST

[L CC]
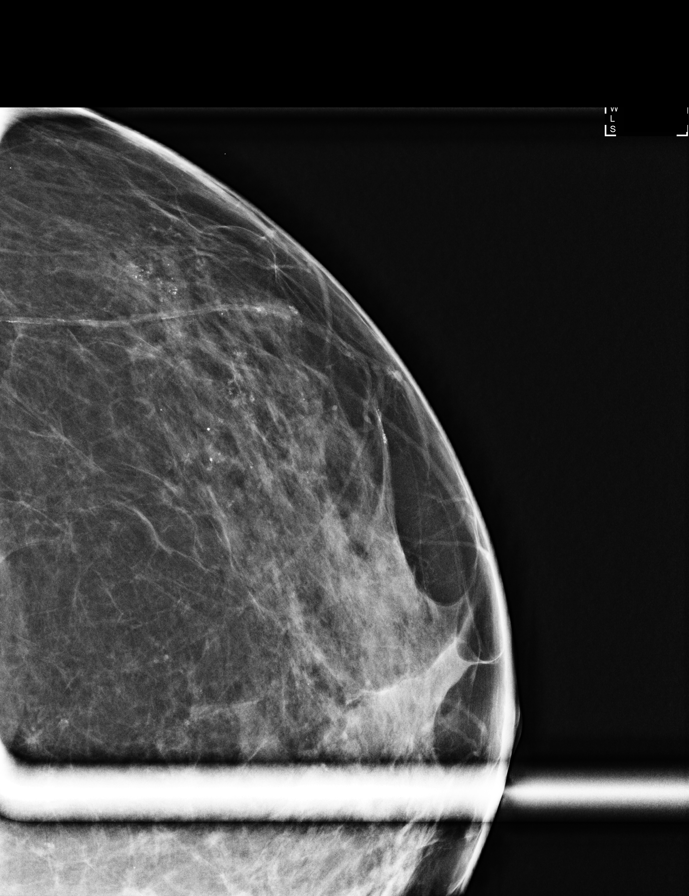

[L ML (1 of 2)]
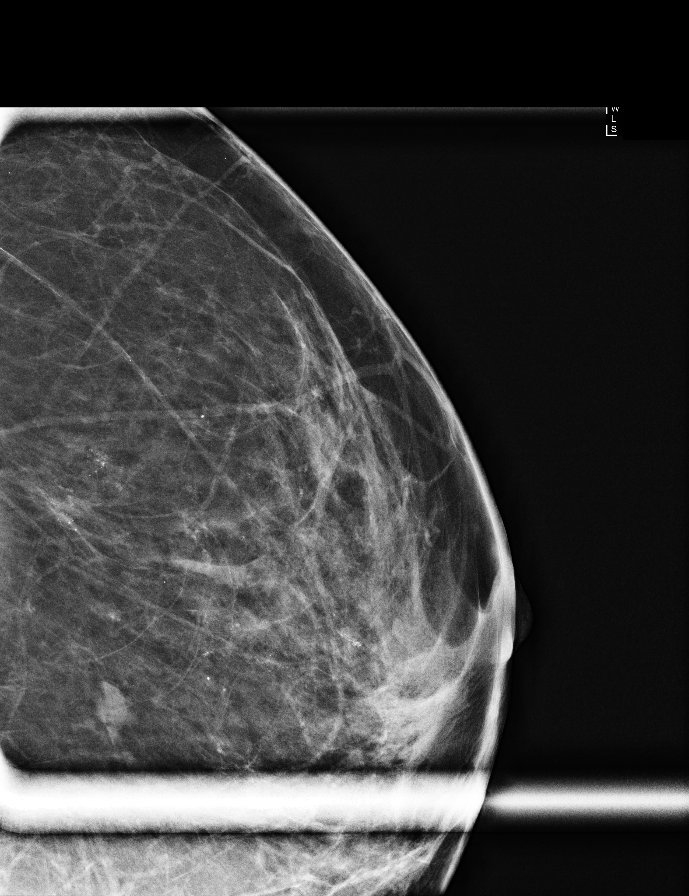

[R CC]
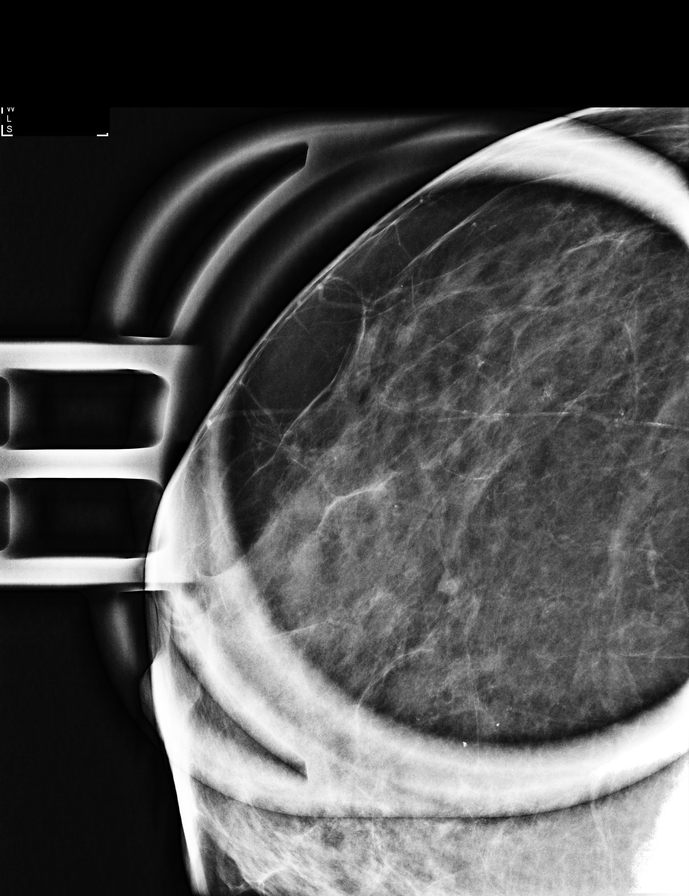

[R ML (1 of 2)]
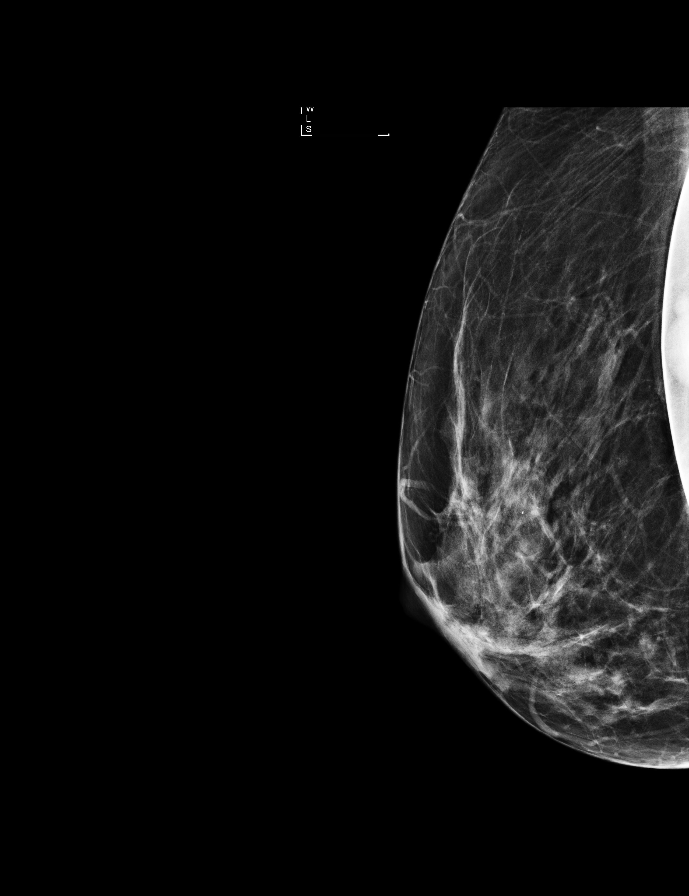

[R ML (2 of 2)]
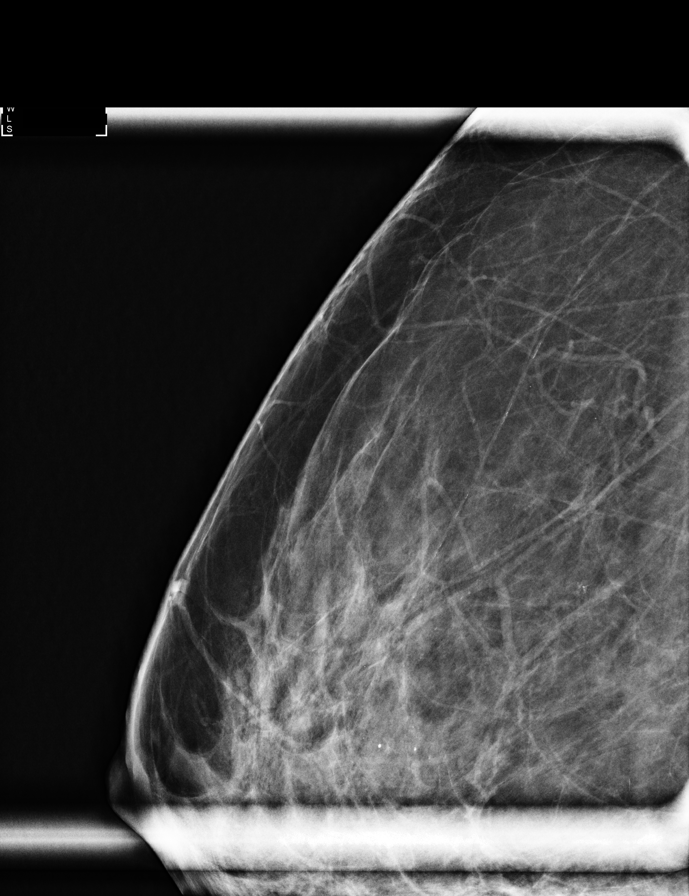

[L ML synth-2D]
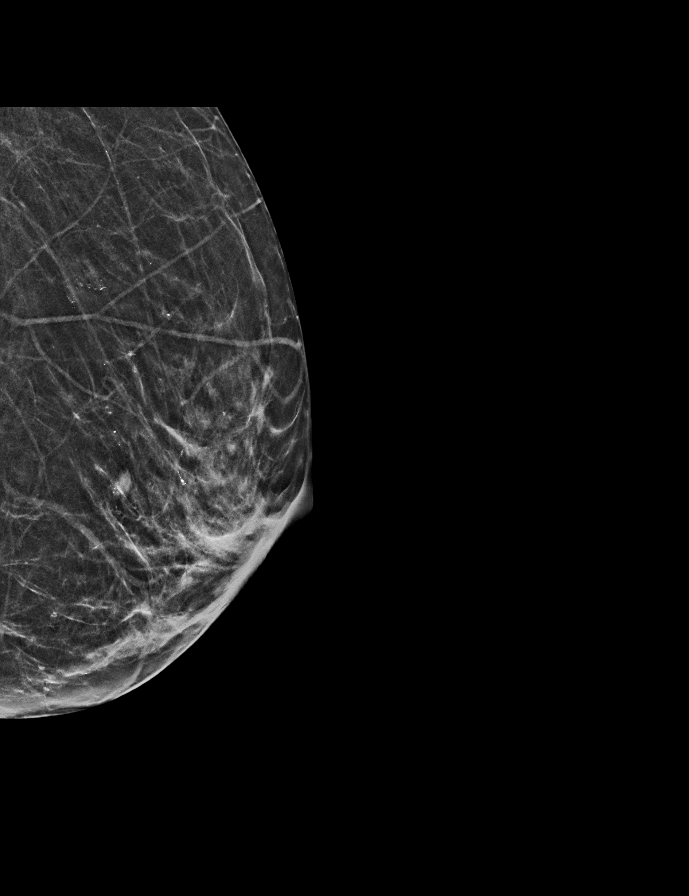

[L ML (2 of 2)]
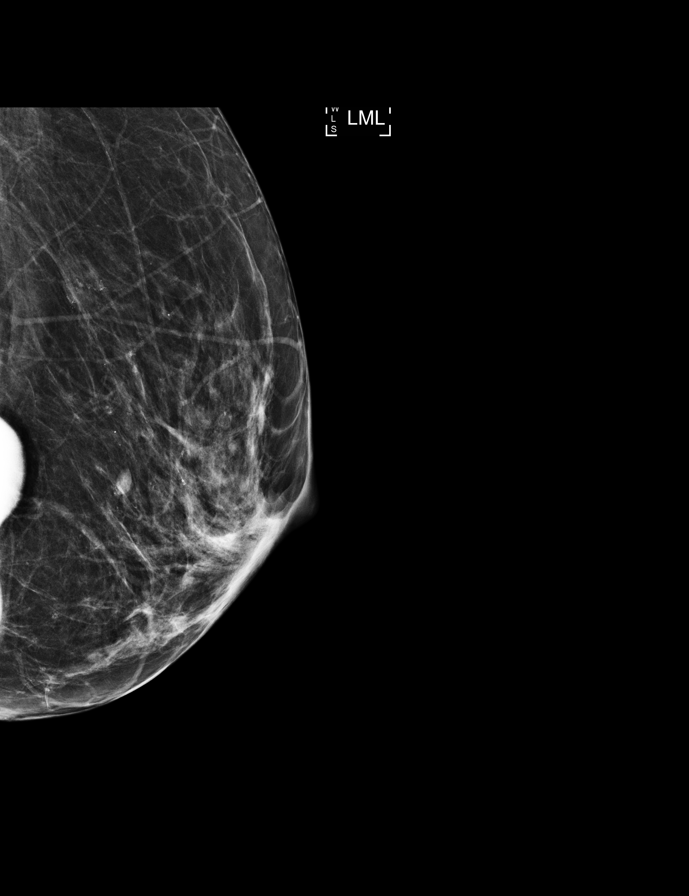

[L ML tomo · tomo slice 21/40.0]
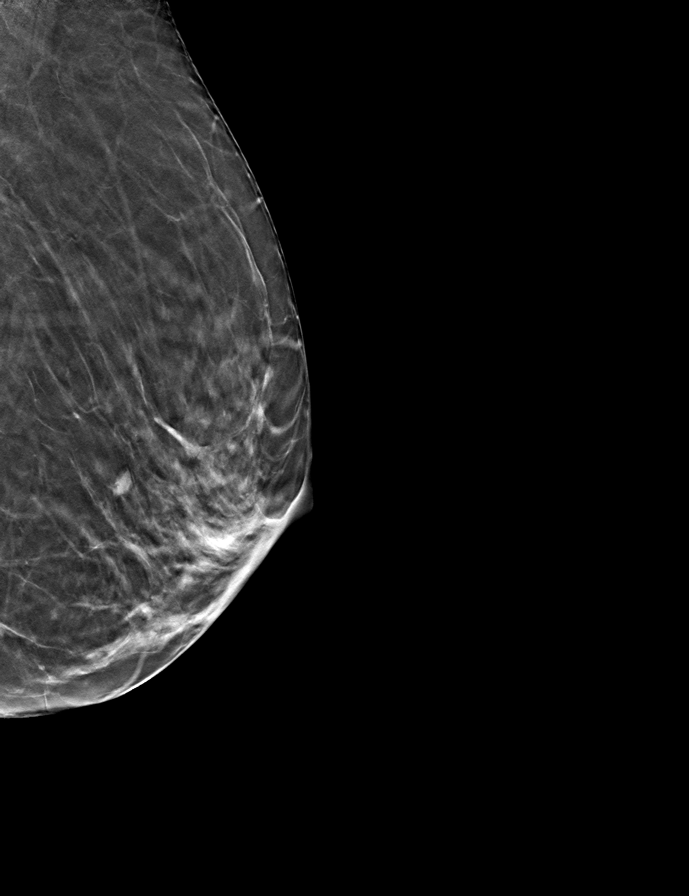

[8 of 12 positions shown; findings below may reference images not displayed]

The patient
states she has not a mammogram in nearly 20 years.

ACR Breast Density Category b: There are scattered areas of
fibroglandular density.
FINDINGS: There is an oval circumscribed mass seen on the ML tomograms of the
left breast measuring approximately 6-7 mm.

Spot compression magnification views were performed over the
upper-outer posterior right breast demonstrating predominantly
scattered groups of predominantly round and punctate calcifications.
Spot compression magnification views were performed over the
upper-outer left breast also demonstrating loosely grouped and
predominantly scattered round and punctate calcifications.

Mammographic images were processed with CAD.

Targeted ultrasound of the left breast was performed demonstrating
an oval well-circumscribed hypoechoic mass at 934 cm from nipple
measuring 0.5 x 0.2 x 0.7 cm. This is felt to represent a cluster of
microcysts in corresponds well with findings seen on mammography.
Normal lymph nodes are seen in the left axilla.
IMPRESSION: 1. Probably benign bilateral breast calcifications.

2. Probably benign left breast mass.

RECOMMENDATION:
Diagnostic mammography of the bilateral breasts with possible left
breast ultrasound in 6 months.

I have discussed the findings and recommendations with the patient.
Results were also provided in writing at the conclusion of the
visit. If applicable, a reminder letter will be sent to the patient
regarding the next appointment.

BI-RADS CATEGORY  3: Probably benign.

## 2018-12-08 ENCOUNTER — Encounter: Payer: Self-pay | Admitting: Gastroenterology

## 2020-01-14 ENCOUNTER — Encounter: Payer: Self-pay | Admitting: Nurse Practitioner

## 2020-02-02 ENCOUNTER — Ambulatory Visit: Payer: Medicare PPO | Admitting: Nurse Practitioner

## 2020-02-02 ENCOUNTER — Encounter: Payer: Self-pay | Admitting: Nurse Practitioner

## 2020-02-02 ENCOUNTER — Other Ambulatory Visit (INDEPENDENT_AMBULATORY_CARE_PROVIDER_SITE_OTHER): Payer: Medicare PPO

## 2020-02-02 ENCOUNTER — Other Ambulatory Visit: Payer: Self-pay

## 2020-02-02 VITALS — BP 112/70 | HR 82 | Ht 66.0 in | Wt 140.0 lb

## 2020-02-02 DIAGNOSIS — Z800 Family history of malignant neoplasm of digestive organs: Secondary | ICD-10-CM

## 2020-02-02 DIAGNOSIS — Z1211 Encounter for screening for malignant neoplasm of colon: Secondary | ICD-10-CM

## 2020-02-02 DIAGNOSIS — Z86010 Personal history of colonic polyps: Secondary | ICD-10-CM

## 2020-02-02 LAB — COMPREHENSIVE METABOLIC PANEL
ALT: 18 U/L (ref 0–35)
AST: 19 U/L (ref 0–37)
Albumin: 4.6 g/dL (ref 3.5–5.2)
Alkaline Phosphatase: 72 U/L (ref 39–117)
BUN: 17 mg/dL (ref 6–23)
CO2: 32 mEq/L (ref 19–32)
Calcium: 9.5 mg/dL (ref 8.4–10.5)
Chloride: 99 mEq/L (ref 96–112)
Creatinine, Ser: 1.17 mg/dL (ref 0.40–1.20)
GFR: 47.54 mL/min — ABNORMAL LOW (ref >60.00)
Glucose, Bld: 100 mg/dL — ABNORMAL HIGH (ref 70–99)
Potassium: 3.4 mEq/L — ABNORMAL LOW (ref 3.5–5.1)
Sodium: 140 mEq/L (ref 135–145)
Total Bilirubin: 0.4 mg/dL (ref 0.2–1.2)
Total Protein: 7 g/dL (ref 6.0–8.3)

## 2020-02-02 LAB — CBC
HCT: 38.7 % (ref 36.0–46.0)
Hemoglobin: 13.4 g/dL (ref 12.0–15.0)
MCHC: 34.6 g/dL (ref 30.0–36.0)
MCV: 94 fl (ref 78.0–100.0)
Platelets: 358 10*3/uL (ref 150.0–400.0)
RBC: 4.12 Mil/uL (ref 3.87–5.11)
RDW: 12.4 % (ref 11.5–15.5)
WBC: 7.1 10*3/uL (ref 4.0–10.5)

## 2020-02-02 MED ORDER — SUTAB 1479-225-188 MG PO TABS: 1.0000 | ORAL_TABLET | ORAL | 0 refills | Status: DC

## 2020-02-02 NOTE — Patient Instructions (Signed)
If you are age 70 or older, your body mass index should be between 23-30. Your Body mass index is 22.6 kg/m. If this is out of the aforementioned range listed, please consider follow up with your Primary Care Provider.  If you are age 70 or younger, your body mass index should be between 19-25. Your Body mass index is 22.6 kg/m. If this is out of the aformentioned range listed, please consider follow up with your Primary Care Provider.   LABS: Your provider has requested that you go to the basement level for lab work before leaving today. Press "B" on the elevator. The lab is located at the first door on the left as you exit the elevator.  HEALTHCARE LAWS AND MY CHART RESULTS: Due to recent changes in healthcare laws, you may see the results of your imaging and laboratory studies on MyChart before your provider has had a chance to review them.  We understand that in some cases there may be results that are confusing or concerning to you. Not all laboratory results come back in the same time frame and the provider may be waiting for multiple results in order to interpret others.  Please give [place] 48 hours in order for your provider to thoroughly review all the results before contacting the office for clarification of your results.   You have been scheduled for a colonoscopy. Please follow written instructions given to you at your visit today.  Please pick up your prep supplies at the pharmacy within the next 1-3 days. If you use inhalers (even only as needed), please bring them with you on the day of your procedure.   It was great seeing you today!  Thank you for entrusting me with your care and choosing [facility].  Noralyn Pick, CRNP

## 2020-02-02 NOTE — Progress Notes (Signed)
02/02/2020 Stacey Perry [id number] [birth year]   CHIEF COMPLAINT: Schedule a colonoscopy   HISTORY OF PRESENT ILLNESS: Stacey Perry is a 70 year old female with a past medical history of colon polyps. Past tonsillectomy, tubal ligation, partial hysterectomy in 1991, a 15cm right ovarian mass (benign) s/p exploratory laparotomy with bilateral salpingo oophorectomy 2018 and breast augmentation surgery.  She recently underwent an iFOBT or Cologuard stool test which she reported was positive. She stated this stool test was mailed to her house by [facility] and she is not sure which test she completed. She no longer has a primary care physician. She was evaluated by gastroenterologist Dr. Lennox Solders at [facility] on 12/25/2019 due to having a positive IFOBT or Cologuard Test.  An EGD and colonoscopy were ordered by and a date was scheduled.  However, the patient cancelled the EGD/colonoscpy with Dr. Lillia Pauls as she wishes to continue her GI management with Dr. Havery Moros. She underwent a colonoscopy 12/23/2015 by Dr. Havery Moros and 7 tubular adenomatous polyps measuring 4 to 20mm were removed at the hepatic flexure, transverse colon, splenic flexure and descending colon.  She was advised to repeat a colonoscopy in 3 years which was not done. Her mother was diagnosed with colon cancer in her 5s.   Currently,  she denies having any dysphagia or heartburn.  No nausea or vomiting.  No upper or lower abdominal pain.  She is passing a normal formed brown bowel movement daily.  No rectal bleeding or melena.  She denies NSAID use. No alcohol use.  Her appetite is good and her weight is stable.  She denies having any laboratory studies done for the past 3 years. Her most recent CBC in Epic was obtained 02/03/2016 post op following her ovarian mass surgery, Hg level was 11.1 on 02/03/2016.   Colonoscopy 12/23/2015: - One 8 mm polyp at the hepatic flexure, removed with a cold snare. Resected and retrieved. - Three 4 to  6 mm polyps in the transverse colon, removed with a cold snare. Resected and retrieved. - Two 5 to 8 mm polyps at the splenic flexure, removed with a cold snare. Resected and retrieved. - One 5 mm polyp in the descending colon, removed with a cold snare. Resected and retrieved. - Internal hemorrhoids. - The examination was otherwise normal. Surgical [P], descending, transverse and hepatic flexure, splenic flexure, polyp (7) -3 year recall colonoscopy  -TUBULAR ADENOMAS AND HYPERPLASTIC COLONIC POLYP. -NO HIGH GRADE DYSPLASIA OR MALIGNANCY IDENTIFIED.    Past Medical History:  Diagnosis Date  . Allergy   . Depression   . Hyperlipidemia    Past Surgical History:  Procedure Laterality Date  . ABDOMINAL HYSTERECTOMY  1991   ovaries were left  . AUGMENTATION MAMMAPLASTY    . BREAST SURGERY     Augmentation (implants)  . LAPAROTOMY N/A 02/02/2016   Procedure: EXPLORATORY LAPAROTOMY BILATERAL SALPINGO OOPHERECTOMY AND PELVIC WASHINGS;  Surgeon: Everitt Amber, MD;  Location: [facility];  Service: Gynecology;  Laterality: N/A;  . TONSILLECTOMY  1957  . TUBAL LIGATION     Social history:  She smoked 1ppd x 7 years. She stopped smoking 30 years ago.No alcohol. No drug use.   Family history: Mother died age 28 with Alzheimer's disease, diagnosed with breast age 23 colon cancer in her 56's . Father 85 died MI.   Allergies  Allergen Reactions  . Latex Other (See Comments)    ? Latex allergy- itching with adhesive tape  . Tape  BANDAIDS-cause itching, bruising, skin peels off ,       Outpatient Encounter Medications as of 02/02/2020  Medication Sig  . Biotin 5000 MCG TABS Take 1 tablet by mouth daily.  . Calcium Carb-Cholecalciferol (CALCIUM 1000 + D) 1000-800 MG-UNIT TABS Take 1 capsule by mouth daily.  . Cinnamon 500 MG TABS Take 1 tablet by mouth daily.  . Magnesium Oxide 250 MG TABS Take 1 tablet by mouth daily.  . Melatonin ER 5 MG TBCR Take 5 mg by mouth at bedtime.  . thiamine  (VITAMIN B-1) 100 MG tablet Take 200 mg by mouth daily.  . vitamin C (ASCORBIC ACID) 500 MG tablet Take 500 mg by mouth daily.  Marland Kitchen VITAMIN D, CHOLECALCIFEROL, PO Take 500 Units by mouth daily.  . Vitamin K, Phytonadione, 100 MCG TABS Take 200 mcg by mouth daily.  Marland Kitchen zinc gluconate 50 MG tablet Take 50 mg by mouth daily.   No facility-administered encounter medications on file as of 02/02/2020.    REVIEW OF SYSTEMS:  Gen: Denies fever, sweats or chills. No weight loss.  CV: Denies chest pain, palpitations or edema. Resp: Denies cough, shortness of breath of hemoptysis.  GI: Denies heartburn, dysphagia, stomach or lower abdominal pain. No diarrhea or constipation.  GU : Denies urinary burning, blood in urine, increased urinary frequency or incontinence. MS: Denies joint pain, muscles aches or weakness. Derm: Denies rash, itchiness, skin lesions or unhealing ulcers. Psych: Denies depression, anxiety or memory loss.  Heme: Denies bruising, bleeding. Neuro:  Denies headaches, dizziness or paresthesias. Endo:  Denies any problems with DM, thyroid or adrenal function.  PHYSICAL EXAM: BP 112/70   Pulse 82   Ht 5\' 6"  (1.676 m)   Wt 140 lb (63.5 kg)   BMI 22.60 kg/m   General: Well developed 70 year old female in no acute distress. Head: Normocephalic and atraumatic. Eyes:  Sclerae non-icteric, conjunctive pink. Ears: Normal auditory acuity. Mouth: Dentition intact. No ulcers or lesions.  Neck: Supple, no lymphadenopathy or thyromegaly.  Lungs: Clear bilaterally to auscultation without wheezes, crackles or rhonchi. Heart: Regular rate and rhythm. No murmur, rub or gallop appreciated.  Abdomen: Soft, nontender, non distended. No masses. No hepatosplenomegaly. Normoactive bowel sounds x 4 quadrants.  Rectal: Deferred.  Musculoskeletal: Symmetrical with no gross deformities. Skin: Warm and dry. No rash or lesions on visible extremities. Extremities: No edema. Neurological: Alert oriented x  4, no focal deficits.  Psychological:  Alert and cooperative. Normal mood and affect.  ASSESSMENT AND PLAN:  57.  71 year old female with a history of  7 tubular adenomatous and hyperplastic polyps which were removed at the time of the colonoscopy 12/2015, 3-year recall was recommended but not completed.  Mother with history of colon cancer diagnosed in her 55s.  -No further FOBT or Cologuard testing not recommended -Colonoscopy benefits and risks discussed including risk with sedation, risk of bleeding, perforation and infection  -CBC, CMP -I discussed with the patient if her laboratory results indicate anemia additional laboratory studies to iron studies would be required. If she is iron deficient an EGD would be appropriate at the time of her colonoscopy.  -Further recommendations to be determined after the above evaluation completed        CC:  Martinique, Betty G, MD

## 2020-02-02 NOTE — Progress Notes (Signed)
Agree with assessment and plan as outlined.  

## 2020-02-03 ENCOUNTER — Telehealth: Payer: Self-pay | Admitting: Nurse Practitioner

## 2020-02-03 MED ORDER — NA SULFATE-K SULFATE-MG SULF 17.5-3.13-1.6 GM/177ML PO SOLN: 1.0000 | Freq: Once | ORAL | 0 refills | Status: AC

## 2020-02-03 NOTE — Telephone Encounter (Signed)
RX for Suprep sent.

## 2020-02-03 NOTE — Telephone Encounter (Signed)
Inbound call from patient stating even with insurance Sutab is over $160 and is requesting something cheaper be sent to the pharmacy if possible.

## 2020-03-25 ENCOUNTER — Encounter: Payer: Medicare PPO | Admitting: Gastroenterology

## 2020-04-28 ENCOUNTER — Telehealth: Payer: Self-pay | Admitting: Gastroenterology

## 2020-04-28 ENCOUNTER — Other Ambulatory Visit: Payer: Self-pay

## 2020-04-28 ENCOUNTER — Ambulatory Visit (AMBULATORY_SURGERY_CENTER): Payer: Medicare PPO | Admitting: *Deleted

## 2020-04-28 VITALS — Ht 66.0 in | Wt 125.0 lb

## 2020-04-28 DIAGNOSIS — Z86010 Personal history of colonic polyps: Secondary | ICD-10-CM

## 2020-04-28 DIAGNOSIS — Z800 Family history of malignant neoplasm of digestive organs: Secondary | ICD-10-CM

## 2020-04-28 MED ORDER — SUTAB 1479-225-188 MG PO TABS: 1.0000 | ORAL_TABLET | ORAL | 0 refills | Status: DC

## 2020-04-28 NOTE — Telephone Encounter (Signed)
I spoke with the patient and PV done and I answered her questions about the sutab coupon.

## 2020-04-28 NOTE — Progress Notes (Signed)
Patient's pre-visit was done today over the phone with the patient due to COVID-19 pandemic. Name,DOB and address verified. Insurance verified. Patient denies any allergies to Eggs and Soy. Patient denies any problems with anesthesia/sedation. Patient denies taking diet pills or blood thinners. Packet of Prep instructions mailed to patient including a copy of a consent form-pt is aware, Stuab Prep coupon included with RX. Patient understands to call us back with any questions or concerns. The patient is COVID-19 fully vaccinated, per patient. Patient is aware of our care-partner policy and HDIXB-84 safety protocol.

## 2020-05-09 ENCOUNTER — Encounter: Payer: Self-pay | Admitting: Gastroenterology

## 2020-05-10 ENCOUNTER — Other Ambulatory Visit: Payer: Self-pay

## 2020-05-10 ENCOUNTER — Encounter: Payer: Self-pay | Admitting: Gastroenterology

## 2020-05-10 ENCOUNTER — Ambulatory Visit (AMBULATORY_SURGERY_CENTER): Payer: Medicare PPO | Admitting: Gastroenterology

## 2020-05-10 VITALS — BP 122/68 | HR 75 | Temp 96.1°F | Resp 17 | Ht 66.0 in | Wt 125.0 lb

## 2020-05-10 DIAGNOSIS — Z800 Family history of malignant neoplasm of digestive organs: Secondary | ICD-10-CM | POA: Diagnosis not present

## 2020-05-10 DIAGNOSIS — D122 Benign neoplasm of ascending colon: Secondary | ICD-10-CM

## 2020-05-10 DIAGNOSIS — D123 Benign neoplasm of transverse colon: Secondary | ICD-10-CM | POA: Diagnosis not present

## 2020-05-10 DIAGNOSIS — Z86010 Personal history of colonic polyps: Secondary | ICD-10-CM

## 2020-05-10 MED ORDER — SODIUM CHLORIDE 0.9 % IV SOLN
500.0000 mL | INTRAVENOUS | Status: DC
Start: 2020-05-10 — End: 2020-05-10

## 2020-05-10 NOTE — Progress Notes (Signed)
Pt's states no medical or surgical changes since previsit or office visit. 

## 2020-05-10 NOTE — Op Note (Signed)
[facility] Patient Name: Stacey Perry Procedure Date: 05/10/2020 8:33 AM MRN: [id number] Endoscopist: Viviann Spare P. Adela Lank , MD Age: 70 Referring MD:  Date of Birth: November 02, 1950 Gender: Female Account #: [id number] Procedure:                Colonoscopy Indications:              High risk colon cancer surveillance: Personal                            history of colonic polyps (7 adenomas removed in                            2017), mother with colon cancer dx age 1s,                            reportedly positive stool test Medicines:                Monitored Anesthesia Care Procedure:                Pre-Anesthesia Assessment:                           - Prior to the procedure, a History and Physical                            was performed, and patient medications and                            allergies were reviewed. The patient's tolerance of                            previous anesthesia was also reviewed. The risks                            and benefits of the procedure and the sedation                            options and risks were discussed with the patient.                            All questions were answered, and informed consent                            was obtained. Prior Anticoagulants: The patient has                            taken no previous anticoagulant or antiplatelet                            agents. ASA Grade Assessment: II - A patient with                            mild systemic disease. After reviewing the risks  and benefits, the patient was deemed in                            satisfactory condition to undergo the procedure.                           After obtaining informed consent, the colonoscope                            was passed under direct vision. Throughout the                            procedure, the patient's blood pressure, pulse, and                            oxygen saturations were monitored  continuously. The                            Olympus [id number]-[id number] (#[id number]) Colonoscope was                            introduced through the anus and advanced to the the                            cecum, identified by appendiceal orifice and                            ileocecal valve. The colonoscopy was performed                            without difficulty. The patient tolerated the                            procedure well. The quality of the bowel                            preparation was good. The ileocecal valve,                            appendiceal orifice, and rectum were photographed. Scope In: 8:43:43 AM Scope Out: 9:05:55 AM Scope Withdrawal Time: 0 hours 17 minutes 17 seconds  Total Procedure Duration: 0 hours 22 minutes 12 seconds  Findings:                 The perianal and digital rectal examinations were                            normal.                           A 4 mm polyp was found in the ascending colon. The                            polyp was sessile. The polyp was removed with a  cold snare. Resection and retrieval were complete.                           A 4 mm polyp was found in the transverse colon. The                            polyp was sessile. The polyp was removed with a                            cold snare. Resection and retrieval were complete.                           The colon was tortuous.                           A few small-mouthed diverticula were found in the                            sigmoid colon.                           Internal hemorrhoids were found during retroflexion.                           The exam was otherwise without abnormality. Complications:            No immediate complications. Estimated blood loss:                            Minimal. Estimated Blood Loss:     Estimated blood loss was minimal. Impression:               - One 4 mm polyp in the ascending colon, removed                             with a cold snare. Resected and retrieved.                           - One 4 mm polyp in the transverse colon, removed                            with a cold snare. Resected and retrieved.                           - Tortuous colon.                           - Diverticulosis in the sigmoid colon.                           - Internal hemorrhoids.                           - The examination was otherwise normal. Recommendation:           - Patient has a contact number available for  emergencies. The signs and symptoms of potential                            delayed complications were discussed with the                            patient. Return to normal activities tomorrow.                            Written discharge instructions were provided to the                            patient.                           - Resume previous diet.                           - Continue present medications.                           - Await pathology results. Remo Lipps P. Iyauna Sing, MD 05/10/2020 9:10:47 AM This report has been signed electronically.

## 2020-05-10 NOTE — Progress Notes (Signed)
PT taken to PACU. Monitors in place. VSS. Report given to RN. 

## 2020-05-10 NOTE — Patient Instructions (Signed)
 Handouts on polyps,diverticulosis ,& hemorrhoids given to you today  Await pathology results on polyps removed    YOU HAD AN ENDOSCOPIC PROCEDURE TODAY AT THE [facility]:   Refer to the procedure report that was given to you for any specific questions about what was found during the examination.  If the procedure report does not answer your questions, please call your gastroenterologist to clarify.  If you requested that your care partner not be given the details of your procedure findings, then the procedure report has been included in a sealed envelope for you to review at your convenience later.  YOU SHOULD EXPECT: Some feelings of bloating in the abdomen. Passage of more gas than usual.  Walking can help get rid of the air that was put into your GI tract during the procedure and reduce the bloating. If you had a lower endoscopy (such as a colonoscopy or flexible sigmoidoscopy) you may notice spotting of blood in your stool or on the toilet paper. If you underwent a bowel prep for your procedure, you may not have a normal bowel movement for a few days.  Please Note:  You might notice some irritation and congestion in your nose or some drainage.  This is from the oxygen used during your procedure.  There is no need for concern and it should clear up in a day or so.  SYMPTOMS TO REPORT IMMEDIATELY:   Following lower endoscopy (colonoscopy or flexible sigmoidoscopy):  Excessive amounts of blood in the stool  Significant tenderness or worsening of abdominal pains  Swelling of the abdomen that is new, acute  Fever of 100F or higher    For urgent or emergent issues, a gastroenterologist can be reached at any hour by calling [phone]. Do not use MyChart messaging for urgent concerns.    DIET:  We do recommend a small meal at first, but then you may proceed to your regular diet.  Drink plenty of fluids but you should avoid alcoholic beverages for 24 hours.  ACTIVITY:   You should plan to take it easy for the rest of today and you should NOT DRIVE or use heavy machinery until tomorrow (because of the sedation medicines used during the test).    FOLLOW UP: Our staff will call the number listed on your records 48-72 hours following your procedure to check on you and address any questions or concerns that you may have regarding the information given to you following your procedure. If we do not reach you, we will leave a message.  We will attempt to reach you two times.  During this call, we will ask if you have developed any symptoms of COVID 19. If you develop any symptoms (ie: fever, flu-like symptoms, shortness of breath, cough etc.) before then, please call [phone].  If you test positive for Covid 19 in the 2 weeks post procedure, please call and report this information to us.    If any biopsies were taken you will be contacted by phone or by letter within the next 1-3 weeks.  Please call us at [phone] if you have not heard about the biopsies in 3 weeks.    SIGNATURES/CONFIDENTIALITY: You and/or your care partner have signed paperwork which will be entered into your electronic medical record.  These signatures attest to the fact that that the information above on your After Visit Summary has been reviewed and is understood.  Full responsibility of the confidentiality of this discharge information lies with you   and/or your care-partner. 

## 2020-05-12 ENCOUNTER — Telehealth: Payer: Self-pay

## 2020-05-12 NOTE — Telephone Encounter (Signed)
  Follow up Call-  Call back number 05/10/2020  Post procedure Call Back phone  # [phone]  Permission to leave phone message Yes  Some recent data might be hidden     Patient questions:  Do you have a fever, pain , or abdominal swelling? No. Pain Score  0 *  Have you tolerated food without any problems? Yes.    Have you been able to return to your normal activities? Yes.    Do you have any questions about your discharge instructions: Diet   No. Medications  No. Follow up visit  No.  Do you have questions or concerns about your Care? No.  Actions: * If pain score is 4 or above: No action needed, pain <4.  1. Have you developed a fever since your procedure? no  2.   Have you had an respiratory symptoms (SOB or cough) since your procedure? no  3.   Have you tested positive for COVID 19 since your procedure no  4.   Have you had any family members/close contacts diagnosed with the COVID 19 since your procedure?  no   If yes to any of these questions please route to Joylene John, RN and Joella Prince, RN
